# Patient Record
Sex: Male | Born: 1960 | Race: White | Hispanic: No | Marital: Married | Smoking: Former smoker
Health system: Southern US, Community
[De-identification: ages and names within clinical notes are randomized; demographics above are authoritative.]

## PROBLEM LIST (undated history)

## (undated) DIAGNOSIS — K449 Diaphragmatic hernia without obstruction or gangrene: Secondary | ICD-10-CM

## (undated) DIAGNOSIS — K219 Gastro-esophageal reflux disease without esophagitis: Secondary | ICD-10-CM

## (undated) DIAGNOSIS — E7800 Pure hypercholesterolemia, unspecified: Secondary | ICD-10-CM

## (undated) HISTORY — PX: ANTERIOR CRUCIATE LIGAMENT REPAIR: SHX115

---

## 2005-04-12 ENCOUNTER — Encounter: Admission: RE | Admit: 2005-04-12 | Discharge: 2005-04-12 | Payer: Self-pay | Admitting: Family Medicine

## 2005-04-12 IMAGING — RF DG UGI W/ HIGH DENSITY W/KUB
17 of 24 series · 17 of 24 positions shown · non-contrast
Comparison: none

CLINICAL DATA: Gastroesophageal reflux.  
 UPPER G.I. SERIES WITH HIGH DENSITY BARIUM AND KUB:
 KUB shows no evidence of obstruction, abnormal calcification or mass.  The bones appear normal.  With the aid of fluoroscopic visualization there was seen a somewhat prominent cricopharyngeus muscle with NAINA?NAINA diverticulum which measures less than 1 cm in maximum dimension.  The patient easily swallowed a 3 x 13 mm barium tablet. 
 There is noted a small  hiatal hernia with moderate gastroesophageal reflux.  The stomach appears normal in size and contour and shows no ulcer or mass.  Duodenal bulb and C-loop are normal.

[Series 1: run · 1 of 1 slices shown (1 of 16)]
[im 1/1]
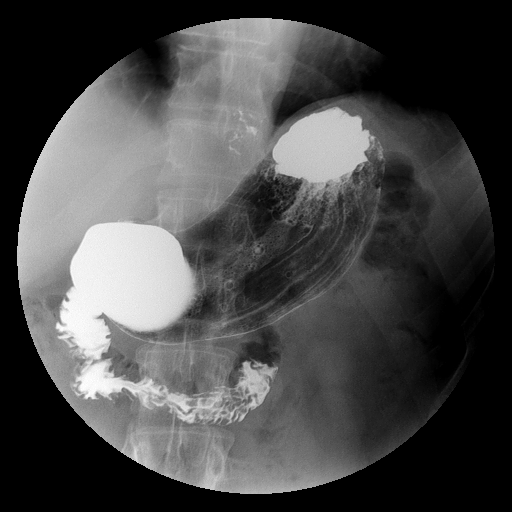

[Series 3: run · 1 of 1 slices shown (2 of 16)]
[im 1/1]
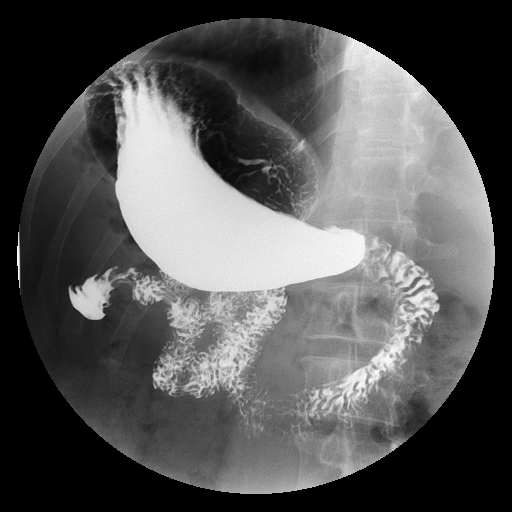

[Series 4: run · 1 of 1 slices shown (3 of 16)]
[im 1/1]
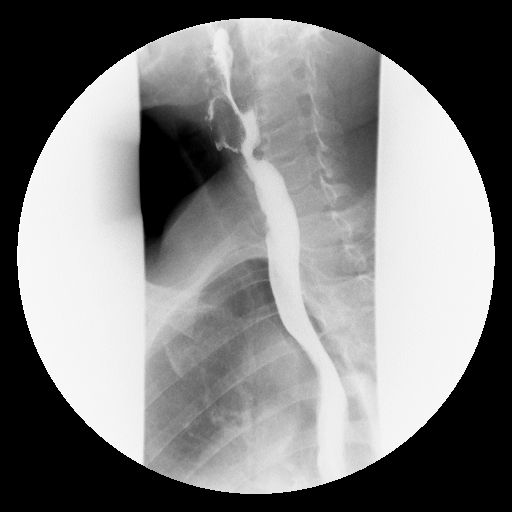

[Series 5: run · 1 of 1 slices shown (4 of 16)]
[im 1/1]
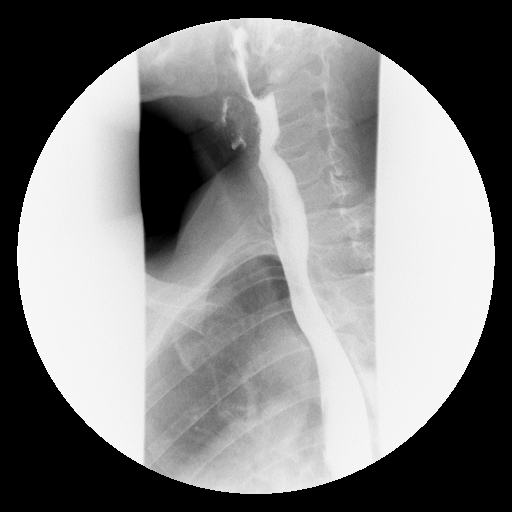

[Series 7: run · 1 of 1 slices shown (5 of 16)]
[im 1/1]
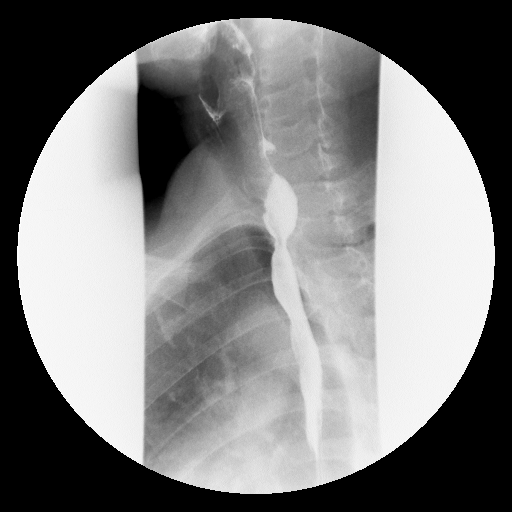

[Series 8: run · 1 of 1 slices shown (6 of 16)]
[im 1/1]
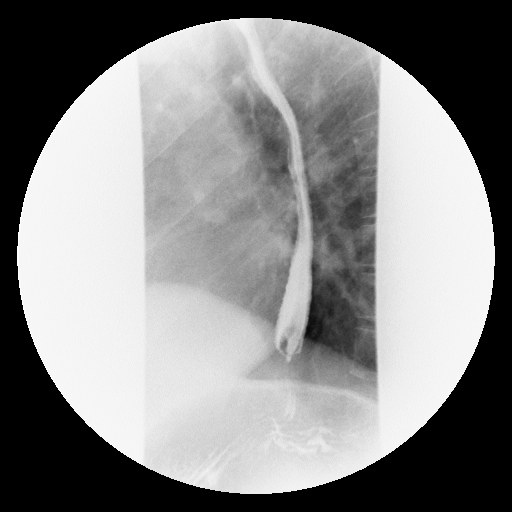

[Series 10: run · 1 of 1 slices shown (7 of 16)]
[im 1/1]
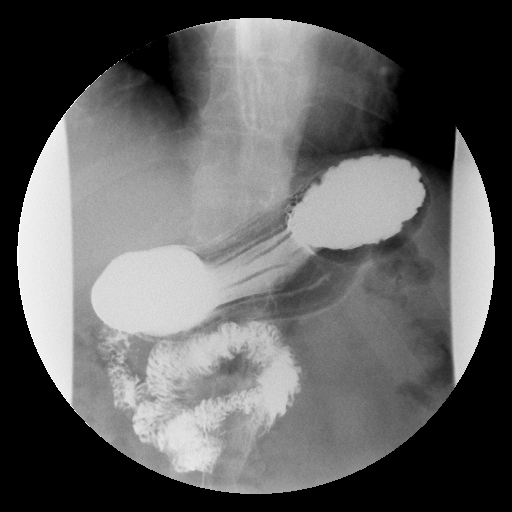

[Series 11: run · 1 of 1 slices shown (8 of 16)]
[im 1/1]
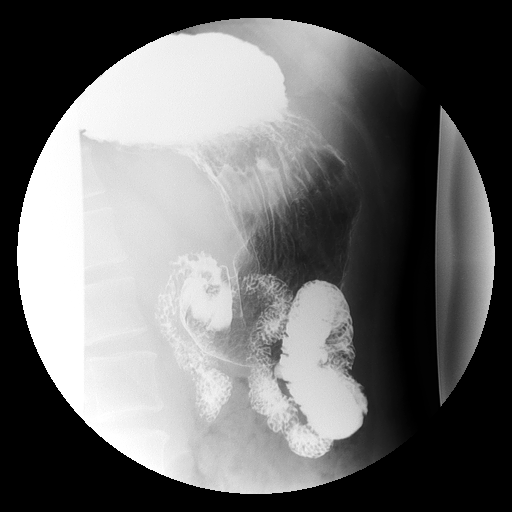

[Series 13: run · 1 of 1 slices shown (9 of 16)]
[im 1/1]
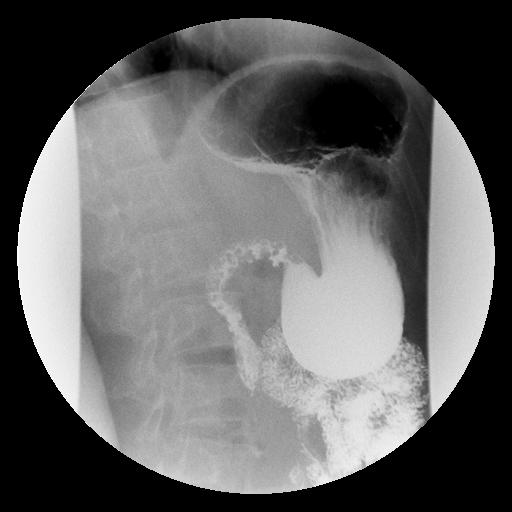

[Series 14: run · 1 of 1 slices shown (10 of 16)]
[im 1/1]
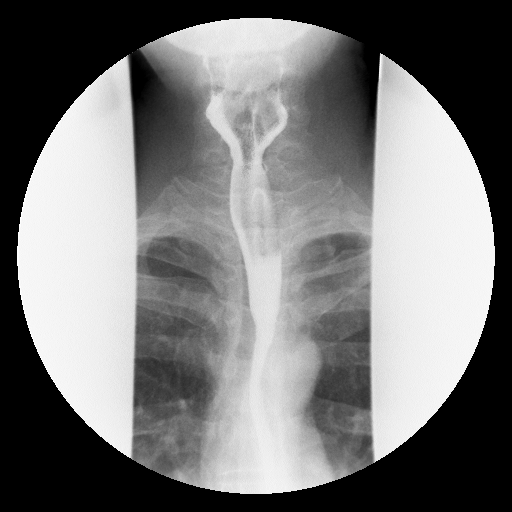

[Series 15: run · 1 of 1 slices shown (11 of 16)]
[im 1/1]
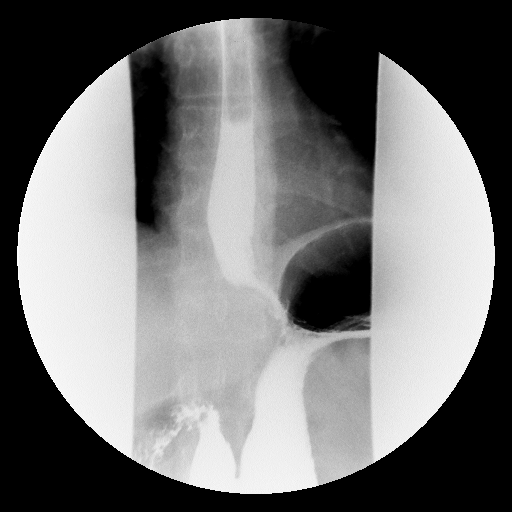

[Series 17: run · 1 of 1 slices shown (12 of 16)]
[im 1/1]
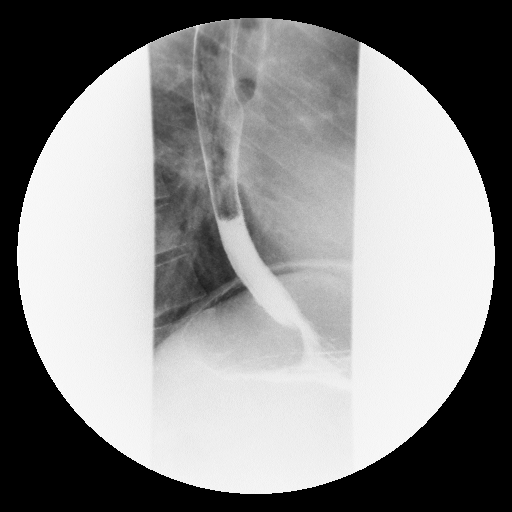

[Series 18: run · 1 of 1 slices shown (13 of 16)]
[im 1/1]
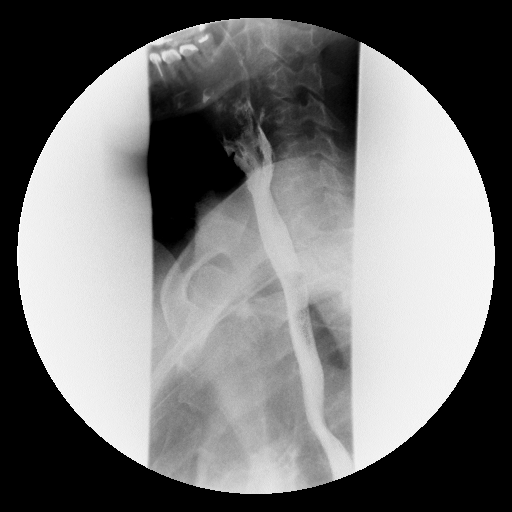

[Series 20: run · 1 of 1 slices shown (14 of 16)]
[im 1/1]
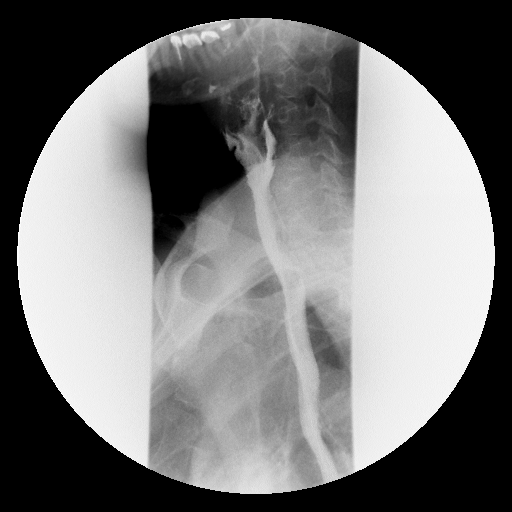

[Series 21: run · 1 of 1 slices shown (15 of 16)]
[im 1/1]
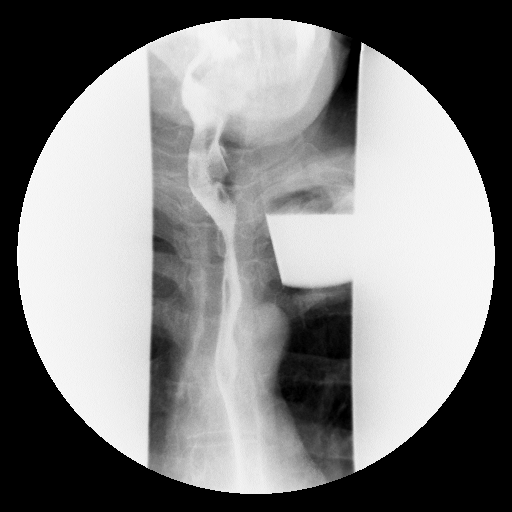

[Series 22: run · 1 of 1 slices shown (16 of 16)]
[im 1/1]
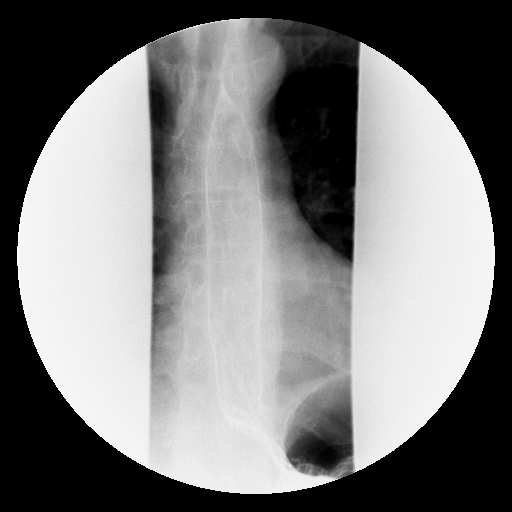

[Series 1002: view not recorded · 0.20mm/px · 1 of 1 slices shown]
[im 1/1]
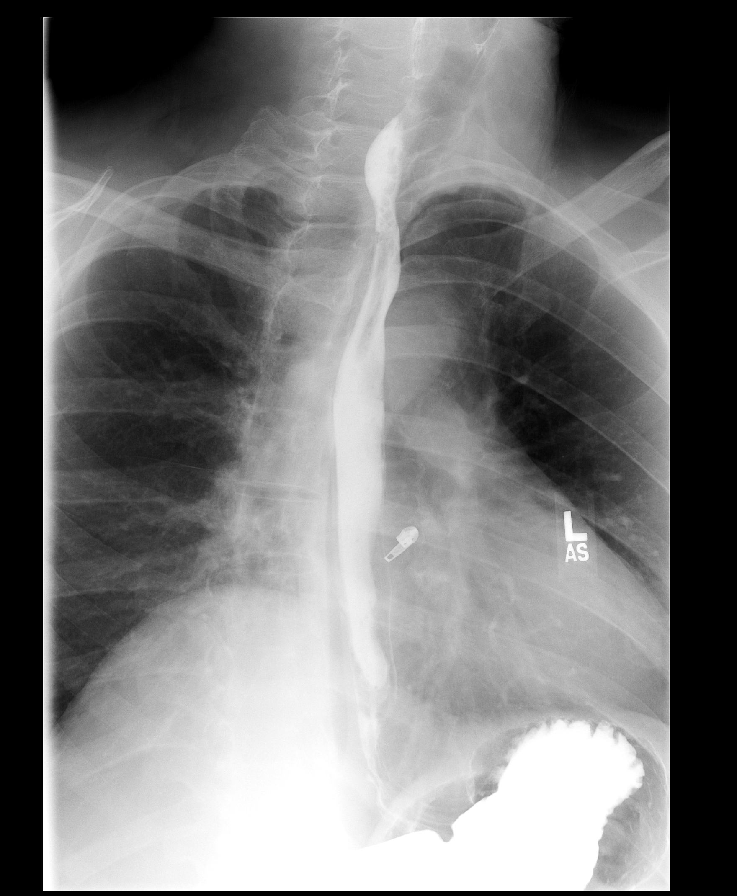

[17 of 24 positions shown; findings below may reference images not displayed]

IMPRESSION: 1.  Hiatal hernia with moderate reflux but no obstruction. .
 2.  Prominent cricopharyngeus muscle.  NAINA?NAINA diverticulum.

## 2010-09-25 ENCOUNTER — Encounter: Payer: Self-pay | Admitting: Family Medicine

## 2010-10-25 ENCOUNTER — Emergency Department (INDEPENDENT_AMBULATORY_CARE_PROVIDER_SITE_OTHER): Payer: BC Managed Care – PPO

## 2010-10-25 ENCOUNTER — Emergency Department (HOSPITAL_BASED_OUTPATIENT_CLINIC_OR_DEPARTMENT_OTHER)
Admission: EM | Admit: 2010-10-25 | Discharge: 2010-10-25 | Disposition: A | Discharge status: Home / Self Care | Payer: BC Managed Care – PPO | Attending: Emergency Medicine | Admitting: Emergency Medicine

## 2010-10-25 DIAGNOSIS — R0602 Shortness of breath: Secondary | ICD-10-CM

## 2010-10-25 DIAGNOSIS — F17200 Nicotine dependence, unspecified, uncomplicated: Secondary | ICD-10-CM | POA: Insufficient documentation

## 2010-10-25 DIAGNOSIS — J4489 Other specified chronic obstructive pulmonary disease: Secondary | ICD-10-CM | POA: Insufficient documentation

## 2010-10-25 DIAGNOSIS — J449 Chronic obstructive pulmonary disease, unspecified: Secondary | ICD-10-CM | POA: Insufficient documentation

## 2010-10-25 DIAGNOSIS — R0609 Other forms of dyspnea: Secondary | ICD-10-CM | POA: Insufficient documentation

## 2010-10-25 DIAGNOSIS — R0989 Other specified symptoms and signs involving the circulatory and respiratory systems: Secondary | ICD-10-CM | POA: Insufficient documentation

## 2010-10-25 LAB — DIFFERENTIAL
Basophils Relative: 1 % (ref 0–1)
Eosinophils Absolute: 0.1 10*3/uL (ref 0.0–0.7)
Eosinophils Relative: 2 % (ref 0–5)
Lymphs Abs: 2.7 10*3/uL (ref 0.7–4.0)
Neutrophils Relative %: 59 % (ref 43–77)

## 2010-10-25 LAB — BASIC METABOLIC PANEL
CO2: 23 mEq/L (ref 19–32)
Calcium: 10.4 mg/dL (ref 8.4–10.5)
Creatinine, Ser: 1.2 mg/dL (ref 0.4–1.5)
GFR calc non Af Amer: >60 mL/min (ref >60)
Glucose, Bld: 124 mg/dL — ABNORMAL HIGH (ref 70–99)
Potassium: 3.9 mEq/L (ref 3.5–5.1)

## 2010-10-25 LAB — CBC
HCT: 46 % (ref 39.0–52.0)
Hemoglobin: 16.1 g/dL (ref 13.0–17.0)
Platelets: 276 10*3/uL (ref 150–400)
RBC: 5.65 MIL/uL (ref 4.22–5.81)
WBC: 8.6 10*3/uL (ref 4.0–10.5)

## 2010-10-25 MED ORDER — IOHEXOL 350 MG/ML SOLN
80.0000 mL | Freq: Once | INTRAVENOUS | Status: AC | PRN
  Administered 2010-10-25: 80 mL via INTRAVENOUS

## 2010-11-18 ENCOUNTER — Inpatient Hospital Stay (HOSPITAL_BASED_OUTPATIENT_CLINIC_OR_DEPARTMENT_OTHER)
Admission: RE | Admit: 2010-11-18 | Discharge: 2010-11-18 | Disposition: A | Discharge status: Home / Self Care | Payer: BC Managed Care – PPO | Source: Ambulatory Visit | Attending: Interventional Cardiology | Admitting: Interventional Cardiology

## 2010-11-18 DIAGNOSIS — R55 Syncope and collapse: Secondary | ICD-10-CM | POA: Insufficient documentation

## 2010-11-18 DIAGNOSIS — R0602 Shortness of breath: Secondary | ICD-10-CM | POA: Insufficient documentation

## 2010-12-07 NOTE — Procedures (Signed)
NAME:  Isaac Casey, Isaac Casey NO.:  [id number]  MEDICAL RECORD NO.:  [id number]           PATIENT TYPE:  LOCATION:                                 FACILITY:  PHYSICIAN:  Corky Crafts, MDDATE OF BIRTH:  [birth year]  DATE OF PROCEDURE:  11/18/2010 DATE OF DISCHARGE:                           CARDIAC CATHETERIZATION   REFERRING PHYSICIAN:  Dr. Ginnie Smart, Alita Chyle.  PROCEDURE PERFORMED:  Left heart catheterization, left ventriculogram, coronary angiogram, abdominal aortogram.  OPERATOR:  Corky Crafts, MD  INDICATION:  Shortness of breath and syncope.  PROCEDURAL NARRATIVE:  The risks and benefits of cardiac catheterization were explained to the patient.  Informed consent was obtained.  He was brought to the cath lab.  He was prepped and draped in the usual sterile fashion.  His right groin was infiltrated with 1% lidocaine.  A 4-French sheath was placed into the right common femoral artery using modified Seldinger technique.  Left coronary artery angiography was performed using a JL-4.0 Pigtail catheter.  The catheter was advanced to about the vessel ostium under fluoroscopic guidance.  Digital angiography was performed in multiple projections and injection of contrast.  The right coronary artery angiography was then performed using a 3-D RC catheter in a similar fashion.  Pigtail catheter was advanced to the ascending aorta and across the aortic valve under fluoroscopic guidance.  Power injection of contrast performed in the RAO projection to image the left ventricle.  The catheter was pulled back under continuous hemodynamic monitoring.  The catheter was withdrawn to the abdominal aorta, and a power injection of contrast was performed in the AP projection.  The sheath was removed and manual compression was used for hemostasis.  FINDINGS: 1. The left main is short vessel and appears widely patent. 2. Left circumflex is a large vessel that appears  angiographically     normal. 3. The OM-1 is a large vessel that appears widely patent.  The     remainder of the circumflex is small vessel and widely patent. 4. Left anterior descending is a large vessel that wraps around the     apex.  There are three small diagonals all of which are widely     patent.  The LAD system appears angiographically normal. 5. The right coronary artery is large dominant vessel.  The     posterolateral artery and PDA are medium-sized vessels and patent.     The right coronary system appears angiographically normal. 6. Left ventriculogram shows mildly decreased LV function, the     estimated ejection fraction is 45%.  There is no significant mitral     regurgitation.  HEMODYNAMICS: 1. Left ventricular pressure 95/80 with an LVEDP of 10 mmHg.  Aortic     pressure 105/71 with a mean aortic pressure of 87 mmHg. 2. Abdominal aortogram.  No abdominal aortic aneurysm.  The left     kidney appears that dual arterial supply.  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  Both sets of the vessels appear     widely patent.  The SMA appears patent.  IMPRESSION: 1.  No significant coronary artery disease. 2. Mildly decreased LV function with an estimated ejection fraction of     45%. 3. Normal hemodynamics. 4. No abdominal aortic aneurysm or renal artery stenosis.  RECOMMENDATIONS:  Continue medical therapy and risk factor modification including smoking cessation.     Corky Crafts, MD     JSV/MEDQ  D:  11/18/2010  T:  11/19/2010  Job:  [id number]  Electronically Signed by Lance Muss MD on 12/07/2010 09:17:35 AM

## 2012-01-04 ENCOUNTER — Other Ambulatory Visit: Payer: Self-pay | Admitting: Diagnostic Neuroimaging

## 2012-01-04 DIAGNOSIS — R55 Syncope and collapse: Secondary | ICD-10-CM

## 2012-01-24 ENCOUNTER — Other Ambulatory Visit: Payer: BC Managed Care – PPO

## 2014-10-28 ENCOUNTER — Emergency Department (HOSPITAL_BASED_OUTPATIENT_CLINIC_OR_DEPARTMENT_OTHER)
Admission: EM | Admit: 2014-10-28 | Discharge: 2014-10-28 | Disposition: A | Discharge status: Home / Self Care | Payer: BLUE CROSS/BLUE SHIELD | Attending: Emergency Medicine | Admitting: Emergency Medicine

## 2014-10-28 ENCOUNTER — Emergency Department (HOSPITAL_BASED_OUTPATIENT_CLINIC_OR_DEPARTMENT_OTHER): Payer: BLUE CROSS/BLUE SHIELD

## 2014-10-28 ENCOUNTER — Encounter (HOSPITAL_BASED_OUTPATIENT_CLINIC_OR_DEPARTMENT_OTHER): Payer: Self-pay

## 2014-10-28 DIAGNOSIS — E780 Pure hypercholesterolemia: Secondary | ICD-10-CM | POA: Diagnosis not present

## 2014-10-28 DIAGNOSIS — Y9389 Activity, other specified: Secondary | ICD-10-CM | POA: Insufficient documentation

## 2014-10-28 DIAGNOSIS — Z7982 Long term (current) use of aspirin: Secondary | ICD-10-CM | POA: Diagnosis not present

## 2014-10-28 DIAGNOSIS — X58XXXA Exposure to other specified factors, initial encounter: Secondary | ICD-10-CM | POA: Diagnosis not present

## 2014-10-28 DIAGNOSIS — S29011A Strain of muscle and tendon of front wall of thorax, initial encounter: Secondary | ICD-10-CM | POA: Diagnosis not present

## 2014-10-28 DIAGNOSIS — Y9289 Other specified places as the place of occurrence of the external cause: Secondary | ICD-10-CM | POA: Diagnosis not present

## 2014-10-28 DIAGNOSIS — Z8719 Personal history of other diseases of the digestive system: Secondary | ICD-10-CM | POA: Diagnosis not present

## 2014-10-28 DIAGNOSIS — Y998 Other external cause status: Secondary | ICD-10-CM | POA: Diagnosis not present

## 2014-10-28 DIAGNOSIS — R0789 Other chest pain: Secondary | ICD-10-CM

## 2014-10-28 DIAGNOSIS — Z720 Tobacco use: Secondary | ICD-10-CM | POA: Diagnosis not present

## 2014-10-28 DIAGNOSIS — S299XXA Unspecified injury of thorax, initial encounter: Secondary | ICD-10-CM | POA: Diagnosis present

## 2014-10-28 HISTORY — DX: Pure hypercholesterolemia, unspecified: E78.00

## 2014-10-28 HISTORY — DX: Gastro-esophageal reflux disease without esophagitis: K21.9

## 2014-10-28 HISTORY — DX: Diaphragmatic hernia without obstruction or gangrene: K44.9

## 2014-10-28 MED ORDER — IBUPROFEN 800 MG PO TABS: 800.0000 mg | ORAL_TABLET | Freq: Three times a day (TID) | ORAL | Status: AC

## 2014-10-28 MED ORDER — METHOCARBAMOL 500 MG PO TABS: 500.0000 mg | ORAL_TABLET | Freq: Two times a day (BID) | ORAL | Status: DC

## 2014-10-28 MED ORDER — METHOCARBAMOL 500 MG PO TABS: 500.0000 mg | ORAL_TABLET | Freq: Once | ORAL | Status: DC

## 2014-10-28 NOTE — ED Provider Notes (Signed)
CSN: [id number]     Arrival date & time 10/28/14  1842 History   First MD Initiated Contact with Patient 10/28/14 2033     Chief Complaint  Patient presents with  . Mass     (Consider location/radiation/quality/duration/timing/severity/associated sxs/prior Treatment) The history is provided by the patient and medical records. No language interpreter was used.     Isaac AgeeWilliam G Casey is a 54 y.o. male  with a hx of GERD, hiatal hernia, high cholesterol presents to the Emergency Department complaining of gradual, persistent, progressively worsening left sided chest pain onset yesterday evening after bailing water out of his boat.  Pt reports he was using his left a bucket to empty the boat of retained rainwater and was "throwing" it over his left shoulder.  Pt reports he had left rib pain worse with movement and deep inspiration.  He reports no treatments PTA. Pt denies chest pain with exertion, SOB with exertion, diaphoresis, nausea, weakness, syncope/near syncope.  Pt denies personal or family Hx of cardiac disease.     Past Medical History  Diagnosis Date  . GERD (gastroesophageal reflux disease)   . Hiatal hernia   . High cholesterol    Past Surgical History  Procedure Laterality Date  . Anterior cruciate ligament repair     No family history on file. History  Substance Use Topics  . Smoking status: Current Some Day Smoker  . Smokeless tobacco: Not on file  . Alcohol Use: No    Review of Systems  Constitutional: Negative for fever, diaphoresis, appetite change, fatigue and unexpected weight change.  HENT: Negative for mouth sores.   Eyes: Negative for visual disturbance.  Respiratory: Negative for cough, chest tightness, shortness of breath and wheezing.   Cardiovascular: Positive for chest pain (chest wall pain).  Gastrointestinal: Negative for nausea, vomiting, abdominal pain, diarrhea and constipation.  Endocrine: Negative for polydipsia, polyphagia and polyuria.   Genitourinary: Negative for dysuria, urgency, frequency and hematuria.  Musculoskeletal: Negative for back pain and neck stiffness.  Skin: Negative for rash.  Allergic/Immunologic: Negative for immunocompromised state.  Neurological: Negative for syncope, light-headedness and headaches.  Hematological: Does not bruise/bleed easily.  Psychiatric/Behavioral: Negative for sleep disturbance. The patient is not nervous/anxious.       Allergies  Wellbutrin  Home Medications   Prior to Admission medications   Medication Sig Start Date End Date Taking? Authorizing Provider  aspirin EC 81 MG tablet Take 81 mg by mouth daily.   Yes Historical Provider, MD  Cholecalciferol (VITAMIN D PO) Take by mouth.   Yes Historical Provider, MD  omega-3 acid ethyl esters (LOVAZA) 1 G capsule Take by mouth 2 (two) times daily.   Yes Historical Provider, MD  SIMVASTATIN PO Take by mouth.   Yes Historical Provider, MD  ibuprofen (ADVIL,MOTRIN) 800 MG tablet Take 1 tablet (800 mg total) by mouth 3 (three) times daily. 10/28/14   Tonee Silverstein, PA-C  methocarbamol (ROBAXIN) 500 MG tablet Take 1 tablet (500 mg total) by mouth 2 (two) times daily. 10/28/14   Jewell Haught, PA-C   BP 129/83 mmHg  Pulse 95  Temp(Src) 98.1 F (36.7 C) (Oral)  Resp 16  Ht 6' (1.829 m)  Wt 185 lb (83.915 kg)  BMI 25.08 kg/m2  SpO2 100% Physical Exam  Constitutional: He appears well-developed and well-nourished. No distress.  Awake, alert, nontoxic appearance  HENT:  Head: Normocephalic and atraumatic.  Mouth/Throat: Oropharynx is clear and moist. No oropharyngeal exudate.  Eyes: Conjunctivae are normal. No scleral icterus.  Neck: Normal range of motion. Neck supple.  Cardiovascular: Normal rate, regular rhythm, normal heart sounds and intact distal pulses.   No murmur heard. Pulmonary/Chest: Effort normal and breath sounds normal. No respiratory distress. He has no wheezes. He exhibits tenderness.  Equal chest  expansion Clear and equal breath sounds Tenderness to palpation along the intercostal muscle between the ninth and 10th ribs No deformity, flail segment, crepitus or ecchymosis  Abdominal: Soft. Bowel sounds are normal. He exhibits no mass. There is no tenderness. There is no rebound and no guarding.  Musculoskeletal: Normal range of motion. He exhibits no edema.  Neurological: He is alert.  Speech is clear and goal oriented Moves extremities without ataxia  Skin: Skin is warm and dry. He is not diaphoretic.  Psychiatric: He has a normal mood and affect.  Nursing note and vitals reviewed.   ED Course  Procedures (including critical care time) Labs Review Labs Reviewed - No data to display  Imaging Review Dg Ribs Unilateral W/chest Left  10/28/2014   CLINICAL DATA:  Lump on the left anterior lower rib cage last night. Painful when he takes a deep breath. Lump is localized by a marker.  EXAM: LEFT RIBS AND CHEST - 3+ VIEW  COMPARISON:  10/25/2010  FINDINGS: Normal heart size and pulmonary vascularity. No focal airspace disease or consolidation in the lungs. No blunting of costophrenic angles. No pneumothorax. Mediastinal contours appear intact.  No fracture or other bone lesions are seen involving the ribs. No rib deformity or radiopaque abnormality is demonstrated corresponding to the location of the palpable lesion.  IMPRESSION: Negative. No focal abnormalities identified that would correspond to the palpable lesion.   Electronically Signed   By: Burman Nieves M.D.   On: 10/28/2014 19:31     EKG Interpretation   Date/Time:  Wednesday October 28 2014 21:21:54 EST Ventricular Rate:  64 PR Interval:  146 QRS Duration: 84 QT Interval:  392 QTC Calculation: 404 R Axis:   4 Text Interpretation:  Normal sinus rhythm Normal ECG No significant change  was found Confirmed by Manus Gunning  MD, STEPHEN [phone]) on 10/28/2014 9:22:23  PM      MDM   Final diagnoses:  Chest wall pain   Intercostal muscle strain, initial encounter   Isaac Casey presents with left rib pain consistent with chest wall pain and muscle strain.  CXR without evidence of PNA or pneumothorax.  ECG nonischemic and pts pain is not consistent with ACS.  Pt given muscle relaxer and antiinflammatory.  Pt is consistently reproducible with movement and palpation of the chest.    I have personally reviewed patient's vitals, nursing note and any pertinent labs or imaging.  I performed an undressed physical exam.    It has been determined that no acute conditions requiring further emergency intervention are present at this time. The patient/guardian have been advised of the diagnosis and plan. I reviewed all labs and imaging including any potential incidental findings. We have discussed signs and symptoms that warrant return to the ED and they are listed in the discharge instructions.    Vital signs are stable at discharge.   BP 129/83 mmHg  Pulse 95  Temp(Src) 98.1 F (36.7 C) (Oral)  Resp 16  Ht 6' (1.829 m)  Wt 185 lb (83.915 kg)  BMI 25.08 kg/m2  SpO2 100%         Dierdre Forth, PA-C 10/28/14 2239  [facility], PA-C 10/28/14 2240  Glynn Octave, MD 10/29/14 1914

## 2014-10-28 NOTE — ED Notes (Signed)
C/o noticed "lump" to left rib area-soreness with movement and deep breath

## 2014-10-28 NOTE — Discharge Instructions (Signed)
1. Medications: robaxin, ibuprofen, usual home medications 2. Treatment: rest, drink plenty of fluids,  3. Follow Up: Please followup with your primary doctor in 2-3 days for discussion of your diagnoses and further evaluation after today's visit; if you do not have a primary care doctor use the resource guide provided to find one; Please return to the ER for worsening pain, shortness of breath or other concerning symptoms   Chest Wall Pain Chest wall pain is pain in or around the bones and muscles of your chest. It may take up to 6 weeks to get better. It may take longer if you must stay physically active in your work and activities.  CAUSES  Chest wall pain may happen on its own. However, it may be caused by:  A viral illness like the flu.  Injury.  Coughing.  Exercise.  Arthritis.  Fibromyalgia.  Shingles. HOME CARE INSTRUCTIONS   Avoid overtiring physical activity. Try not to strain or perform activities that cause pain. This includes any activities using your chest or your abdominal and side muscles, especially if heavy weights are used.  Put ice on the sore area.  Put ice in a plastic bag.  Place a towel between your skin and the bag.  Leave the ice on for 15-20 minutes per hour while awake for the first 2 days.  Only take over-the-counter or prescription medicines for pain, discomfort, or fever as directed by your caregiver. SEEK IMMEDIATE MEDICAL CARE IF:   Your pain increases, or you are very uncomfortable.  You have a fever.  Your chest pain becomes worse.  You have new, unexplained symptoms.  You have nausea or vomiting.  You feel sweaty or lightheaded.  You have a cough with phlegm (sputum), or you cough up blood. MAKE SURE YOU:   Understand these instructions.  Will watch your condition.  Will get help right away if you are not doing well or get worse. Document Released: 08/21/2005 Document Revised: 11/13/2011 Document Reviewed:  04/17/2011 [facility] Patient Information 2015 [place], [place]. This information is not intended to replace advice given to you by your health care provider. Make sure you discuss any questions you have with your health care provider.    Emergency Department Resource Guide 1) Find a Doctor and Pay Out of Pocket Although you won't have to find out who is covered by your insurance plan, it is a good idea to ask around and get recommendations. You will then need to call the office and see if the doctor you have chosen will accept you as a new patient and what types of options they offer for patients who are self-pay. Some doctors offer discounts or will set up payment plans for their patients who do not have insurance, but you will need to ask so you aren't surprised when you get to your appointment.  2) Contact Your Local Health Department Not all health departments have doctors that can see patients for sick visits, but many do, so it is worth a call to see if yours does. If you don't know where your local health department is, you can check in your phone book. The CDC also has a tool to help you locate your state's health department, and many state websites also have listings of all of their local health departments.  3) Find a Walk-in Clinic If your illness is not likely to be very severe or complicated, you may want to try a walk in clinic. These are popping up all over the  country in pharmacies, drugstores, and shopping centers. They're usually staffed by nurse practitioners or physician assistants that have been trained to treat common illnesses and complaints. They're usually fairly quick and inexpensive. However, if you have serious medical issues or chronic medical problems, these are probably not your best option.  No Primary Care Doctor: - Call Health Connect at  [phone] - they can help you locate a primary care doctor that  accepts your insurance, provides certain services, etc. - Physician  Referral Service- [phone]  Chronic Pain Problems: Organization         Address  Phone   Notes  [facility]  [phone] Patients need to be referred by their primary care doctor.   Medication Assistance: Organization         Address  Phone   Notes  [facility] Medication [facility] [address] [place] [place], [place] [address] [phone] --Must be a resident of [facility] -- Must have NO insurance coverage whatsoever (no Medicaid/ Medicare, etc.) -- The pt. MUST have a primary care doctor that directs their care regularly and follows them in the community   [facility]  [phone]   [employer]  [phone]    Agencies that provide inexpensive medical care: Organization         Address  Phone   Notes  [facility]  [phone]   [facility]    [phone]   [facility] [address] [place], [place] [address] [phone]   [facility] [address] [place], [place] [phone]   Planned Parenthood    [phone]   [facility]    [phone]   [facility] and [facility]  [address], [place] Phone:  [phone], Fax:  [phone] Hours of Operation:  9 am - 6 pm, M-F.  Also accepts Medicaid/Medicare and self-pay.  [facility]  [address], [place] Phone: [phone], Fax: [phone]. Hours of Operation:  8:30 am - 5:30 pm, M-F.  Also accepts Medicaid and self-pay.  [facility] [place] [address], [place] Phone: [phone]   [facility] [address] Natasha Bence [place], [place] [phone], Ext. [phone] Mondays & Thursdays: 7-9 AM.  First 15 patients are seen on a first come, first serve basis.    Medicaid-accepting [facility] Providers:  Organization         Address  Phone   Notes  [facility] [address] [place] [address], [place] [phone] Also accepts self-pay patients.  [facility] [address] Laurell Josephs [place], [place]  [phone]   [facility] [address], [place] [phone]   [facility] [address] [place] [phone]   Renaye Rakers [address], [place]   [phone] Only accepts [place] Access [place] patients after they have their name applied to their card.   Self-Pay (no insurance) in [facility]:  Organization         Address  Phone   Notes  Sickle Cell Patients, [facility] [address] [employer], [place] [phone]   [facility] [address] [place], [place] [phone]   [facility]   [address], [place] [phone]   [facility]/Dr. Osei-Bonsu  [address] [place] [address], [place] or [address], [place] [phone] Phone number for both [place] and [place] locations is the same.  [facility]gent [facility] and [facility] [address], [place] [phone]   [facility] [address], [place] or [address] [phone]   [facility] [address] [place] [phone], phone; [phone], fax Sees patients 1st and 3rd Saturday of every month.  Must not qualify for public or private insurance (i.e. Medicaid, Medicare, [place] Health Choice, Veterans' Benefits)  Household income should be no more than 200% of the poverty level The clinic cannot treat you if you are pregnant or think you are pregnant  Sexually transmitted diseases are not treated at the clinic.    Dental Care: Organization         Address  Phone  Notes  [facility] Department of [facility] [address] [place], [place] [phone] Accepts children up to age 51 who are enrolled  in [place] or [place] Health Choice; pregnant women with a Medicaid card; and children who have applied for Medicaid or [place] Health Choice, but were declined, whose parents can pay a reduced fee at time of service.  [facility] Department of [facility]  [address], [place] [phone] Accepts children up to age 12 who are enrolled in [place] or [place] Health Choice; pregnant women with a Medicaid card; and children who have applied for Medicaid or [place] Health Choice, but were declined, whose parents can pay a reduced fee at time of service.  [facility]  [address] [place], [place] [phone] Patients are seen by appointment only. Walk-ins are not accepted. [facility] will see patients 24 years of age and older. Monday - Tuesday (8am-5pm) Most Wednesdays (8:30-5pm) $30 per visit, cash only  [facility] Adult Dental Access PRO[facility]  [address], [facility] [phone] Patients are seen by appointment only. Walk-ins are not accepted. [facility] will see patients 82 years of age and older. One Wednesday Evening (Monthly: Volunteer Based).  $30 per visit, cash only  [employer]  [phone] for adults; Children under age 48, call Graduate Pediatric Dentistry at [phone]. Children aged 33-14, please call [phone] to request a pediatric application.  Dental services are provided in all areas of dental care including fillings, crowns and bridges, complete and partial dentures, implants, gum treatment, root canals, and extractions. Preventive care is also provided. Treatment is provided to both adults and children. Patients are selected via a lottery and there is often a waiting list.   [facility] [address], [place]  [phone] [email or web address]   [facility] [address] [place], [place] [phone], Ext. [phone] Second and Fourth Thursday of each month, opens at  6:30 AM; Clinic ends at 9 AM.  Patients are seen on a first-come first-served basis, and a limited number are seen during each clinic.   [facility]  [address] Ether Griffins [place], [place] [phone]   Eligibility Requirements You must have lived in [place], [place], or [place] counties for at least the last three months.   You cannot be eligible for state or federal sponsored [place], including [employer], [place], or [employer].   You  generally cannot be eligible for healthcare insurance through your employer.    How to apply: Eligibility screenings are held every Tuesday and Wednesday afternoon from 1:00 pm until 4:00 pm. You do not need an appointment for the interview!  [facility] [address] [place], [place] [phone]   [facility] Health Department  [phone]   [facility] Health Department  [phone]   [facility] Health Department  [phone]    Behavioral Health Resources in the Community: Intensive Outpatient Programs Organization         Address  Phone  Notes  [facility] [address], [place], [place] [phone]   [facility] Out[facility] [address], [place], [place] [phone]   [facility] [address], [place], [place]  [phone]   [facility] Mental Health [address]  [place], [place] [phone] or [phone]   Substance Abuse Resources Organization         Address  Phone  Notes  [facility] Drug Services  [phone]   [facility]  [phone]   [facility]  [phone]   Floydene Flock  [phone]   Residential & Outpatient Substance Abuse Program  [phone]   Psychological Services Organization         Address  Phone  Notes  [facility]  [phone]   [facility]  [phone]   [facility] [address], [place]  [phone] or [phone]    Mobile Crisis Teams Organization         Address  Phone  Notes  [facility], Mobile Crisis Care Unit  [phone]   [facility]  [address] [place], [place] [phone]   Doristine Locks [address] [place] [phone]    Self-Help/Support Groups Organization         Address  Phone             Notes  [facility] [place] - variety of support groups  [phone]- I7437963 Call for more information  Narcotics Anonymous (NA), [facility] [address], [employer] [place]  2 meetings at this location   Statistician         Address  Phone  Notes  [facility] [address],    [place]  [phone]   [facility]  [address], [place] [address], [place], [place] [phone]   [facility] [address] [place], [place] [phone] Admissions: 8am-3pm M-F  [facility] [address],    [place], [place] [phone]   [facility] [address] [place], [place], [place] [phone]   [facility] [address]  [place], [place] [phone]   [facility] - Intensive Outpatient [address] Laurell Josephs [address], [place], [place] [phone]   [facility] ([facility] [address] [place]  [place], [place] [phone] or [phone]   [facility] ([facility]) [address] [place], [place] [phone] Accepts Medicaid  [facility] [address],  [place] [phone] Substance Abuse/Addiction Treatment   [facility] Organization         Address  Phone  Notes  [facility]  [phone]   Angie Fava, PhD [address] Ervin Knack [place], [place]   [phone] or [phone]   [facility]   [address]  [address] [place], [place] [phone]) [phone]   [facility]  [address], [place], [place] [phone]) [phone] Insurance/Medicaid/sponsorship through [facility]  [facility] [address]                                    [place], [place] [phone]) [phone] Therapy/tele-psych/case  [facility] [address]   [place], [place] [phone](336) [phone]    Dr. Lolly MustacheArfeen  [phone](336) [phone]   [facility] [place]  [employer] [facility] 1) [address], [place] 2) [address], [place] 3)  [address], [place] [phone]) [phone](336) [phone]  [phone]) [phone]   [facility] Child Abuse Hotline [phone](336) [phone] or [phone](336) [phone] (After Hours)

## 2016-07-07 ENCOUNTER — Other Ambulatory Visit: Payer: Self-pay | Admitting: Family Medicine

## 2016-07-07 ENCOUNTER — Ambulatory Visit
Admission: RE | Admit: 2016-07-07 | Discharge: 2016-07-07 | Disposition: A | Discharge status: Home / Self Care | Payer: BLUE CROSS/BLUE SHIELD | Source: Ambulatory Visit | Attending: Family Medicine | Admitting: Family Medicine

## 2016-07-07 DIAGNOSIS — R0602 Shortness of breath: Secondary | ICD-10-CM

## 2016-12-26 ENCOUNTER — Ambulatory Visit
Admission: RE | Admit: 2016-12-26 | Discharge: 2016-12-26 | Disposition: A | Discharge status: Home / Self Care | Payer: Managed Care, Other (non HMO) | Source: Ambulatory Visit | Attending: Family Medicine | Admitting: Family Medicine

## 2016-12-26 ENCOUNTER — Other Ambulatory Visit: Payer: Self-pay | Admitting: Family Medicine

## 2016-12-26 DIAGNOSIS — R079 Chest pain, unspecified: Secondary | ICD-10-CM

## 2017-01-25 ENCOUNTER — Other Ambulatory Visit: Payer: Self-pay | Admitting: Family Medicine

## 2017-01-25 DIAGNOSIS — R079 Chest pain, unspecified: Secondary | ICD-10-CM

## 2017-01-30 ENCOUNTER — Ambulatory Visit
Admission: RE | Admit: 2017-01-30 | Discharge: 2017-01-30 | Disposition: A | Discharge status: Home / Self Care | Payer: Managed Care, Other (non HMO) | Source: Ambulatory Visit | Attending: Family Medicine | Admitting: Family Medicine

## 2017-01-30 DIAGNOSIS — R079 Chest pain, unspecified: Secondary | ICD-10-CM

## 2018-06-07 ENCOUNTER — Other Ambulatory Visit: Payer: Self-pay | Admitting: Acute Care

## 2018-06-07 DIAGNOSIS — Z87891 Personal history of nicotine dependence: Secondary | ICD-10-CM

## 2018-06-07 DIAGNOSIS — Z122 Encounter for screening for malignant neoplasm of respiratory organs: Secondary | ICD-10-CM

## 2018-06-19 ENCOUNTER — Ambulatory Visit (INDEPENDENT_AMBULATORY_CARE_PROVIDER_SITE_OTHER)
Admission: RE | Admit: 2018-06-19 | Discharge: 2018-06-19 | Disposition: A | Discharge status: Home / Self Care | Payer: Managed Care, Other (non HMO) | Source: Ambulatory Visit | Attending: Acute Care | Admitting: Acute Care

## 2018-06-19 ENCOUNTER — Ambulatory Visit (INDEPENDENT_AMBULATORY_CARE_PROVIDER_SITE_OTHER): Payer: Managed Care, Other (non HMO) | Admitting: Acute Care

## 2018-06-19 ENCOUNTER — Encounter: Payer: Self-pay | Admitting: Acute Care

## 2018-06-19 DIAGNOSIS — Z87891 Personal history of nicotine dependence: Secondary | ICD-10-CM

## 2018-06-19 DIAGNOSIS — Z122 Encounter for screening for malignant neoplasm of respiratory organs: Secondary | ICD-10-CM

## 2018-06-19 NOTE — Progress Notes (Signed)
Shared Decision Making Visit Lung Cancer Screening Program [phone])   Eligibility:  Age 57 y.o.  Pack Years Smoking History Calculation 38 pack year smoking history (# packs/per year x # years smoked)  Recent History of coughing up blood  no  Unexplained weight loss? no ( >Than 15 pounds within the last 6 months )  Prior History Lung / other cancer no (Diagnosis within the last 5 years already requiring surveillance chest CT Scans).  Smoking Status Former Smoker  Former Smokers: Years since quit: 2 years  Quit Date: 03/2016  Visit Components:  Discussion included one or more decision making aids. yes  Discussion included risk/benefits of screening. yes  Discussion included potential follow up diagnostic testing for abnormal scans. yes  Discussion included meaning and risk of over diagnosis. yes  Discussion included meaning and risk of False Positives. yes  Discussion included meaning of total radiation exposure. yes  Counseling Included:  Importance of adherence to annual lung cancer LDCT screening. yes  Impact of comorbidities on ability to participate in the program. yes  Ability and willingness to under diagnostic treatment. yes  Smoking Cessation Counseling:  Current Smokers:   Discussed importance of smoking cessation. NA  Information about tobacco cessation classes and interventions provided to patient. yes  Patient provided with "ticket" for LDCT Scan. yes  Symptomatic Patient. no  Counseling   Diagnosis Code: Tobacco Use Z72.0  Asymptomatic Patient yes  Counseling (Intermediate counseling: > three minutes counseling) U0454  Former Smokers:   Discussed the importance of maintaining cigarette abstinence. yes  Diagnosis Code: Personal History of Nicotine Dependence. U98.119  Information about tobacco cessation classes and interventions provided to patient. Yes  Patient provided with "ticket" for LDCT Scan. yes  Written Order for Lung Cancer  Screening with LDCT placed in Epic. Yes (CT Chest Lung Cancer Screening Low Dose W/O CM) [id number] Z12.2-Screening of respiratory organs Z87.891-Personal history of nicotine dependence  I spent 25 minutes of face to face time with Isaac Casey discussing the risks and benefits of lung cancer screening. We viewed a power point together that explained in detail the above noted topics. We took the time to pause the power point at intervals to allow for questions to be asked and answered to ensure understanding. We discussed that he had taken the single most powerful action possible to decrease his risk of developing lung cancer when he quit smoking. I counseled him to remain smoke free, and to contact me if he ever had the desire to smoke again so that I can provide resources and tools to help support the effort to remain smoke free. We discussed the time and location of the scan, and that either  Isaac Miyamoto RN or I will call with the results within  24-48 hours of receiving them. He has my card and contact information in the event he needs to speak with me, in addition to a copy of the power point we reviewed as a resource. He verbalized understanding of all of the above and had no further questions upon leaving the office.     I explained to the patient that there has been a high incidence of coronary artery disease noted on these exams. I explained that this is a non-gated exam therefore degree or severity cannot be determined. This patient is on statin therapy. I have asked the patient to follow-up with their PCP regarding any incidental finding of coronary artery disease and management with diet or medication as they feel is clinically  indicated. The patient verbalized understanding of the above and had no further questions.     Bevelyn Ngo, NP 06/19/2018 10:28 AM

## 2018-06-20 ENCOUNTER — Telehealth: Payer: Self-pay | Admitting: Acute Care

## 2018-06-20 DIAGNOSIS — Z87891 Personal history of nicotine dependence: Secondary | ICD-10-CM

## 2018-06-20 DIAGNOSIS — Z122 Encounter for screening for malignant neoplasm of respiratory organs: Secondary | ICD-10-CM

## 2018-06-24 NOTE — Telephone Encounter (Signed)
Pt informed of CT results per Sarah Groce, NP.  PT verbalized understanding.  Copy sent to PCP.  Order placed for 1 yr f/u CT.  

## 2018-11-14 NOTE — Progress Notes (Signed)
Patient is here for follow up visit.  Subjective:   @Patient  ID: Isaac Casey, male    DOB: [birth year], 59 y.o.   MRN: [id number]  Chief Complaint  Patient presents with  . Shortness of Breath    8 week F/U  . Abnormal ECG     HPI  58 y/o Caucasian male with former smoking history, incidentally found to have coronary and aortic atherosclerosis, exertional dyspnea.  I had seen the patient for exertional dyspnea. His echocardiogram, similar to back in 2012, showed mildly reduced LVEF 45%. No evidence of ischemia or infarction on stress test. Coronary angiogram showed normal coronaries in 2012. I felt that it was unlikely that he now has obstructive CAD with otherwise unchanged symptoatology and LVEF. I suspected his exertional dyspnea may be more related to his h/o smoking and possible COPD. I recommended  PFT and pulmonology evaluation. In the meantime, I started him on low dose losartan to treat his mildly reduced LVEF and mildly elevated blood pressure. Given the question of asthma history, I avoided beta blocker.  Patient is here for 8 week follow up. He never saw pulmonologist. He still has some exertional dyspnea. He reports feeling short of breath 5 min into elliptical exercise, which eventually improves. He reports occasional wheezing. He denies chest pain. He is wearing CPAP at night and has had increased energy level during daytime.   Past Medical History:  Diagnosis Date  . GERD (gastroesophageal reflux disease)   . Hiatal hernia   . High cholesterol      Past Surgical History:  Procedure Laterality Date  . ANTERIOR CRUCIATE LIGAMENT REPAIR       Social History   Socioeconomic History  . Marital status: Married    Spouse name: Not on file  . Number of children: 5  . Years of education: Not on file  . Highest education level: Not on file  Occupational History  . Not on file  Social Needs  . Financial resource strain: Not on file  . Food insecurity:   Worry: Not on file    Inability: Not on file  . Transportation needs:    Medical: Not on file    Non-medical: Not on file  Tobacco Use  . Smoking status: Former Smoker    Packs/day: 1.00    Years: 38.00    Pack years: 38.00    Types: Cigarettes  . Smokeless tobacco: Never Used  . Tobacco comment: Pt has smoked in 3 years  Substance and Sexual Activity  . Alcohol use: Yes    Comment: Occasional "once a week"  . Drug use: No  . Sexual activity: Not on file  Lifestyle  . Physical activity:    Days per week: Not on file    Minutes per session: Not on file  . Stress: Not on file  Relationships  . Social connections:    Talks on phone: Not on file    Gets together: Not on file    Attends religious service: Not on file    Active member of club or organization: Not on file    Attends meetings of clubs or organizations: Not on file    Relationship status: Not on file  . Intimate partner violence:    Fear of current or ex partner: Not on file    Emotionally abused: Not on file    Physically abused: Not on file    Forced sexual activity: Not on file  Other Topics Concern  .  Not on file  Social History Narrative  . Not on file     Current Outpatient Medications on File Prior to Visit  Medication Sig Dispense Refill  . aspirin EC 81 MG tablet Take 81 mg by mouth daily.    . Cholecalciferol (VITAMIN D PO) Take by mouth.    Marland Kitchen ibuprofen (ADVIL,MOTRIN) 800 MG tablet Take 1 tablet (800 mg total) by mouth 3 (three) times daily. 21 tablet 0  . losartan (COZAAR) 25 MG tablet Take 1 tablet by mouth daily.    Marland Kitchen omega-3 acid ethyl esters (LOVAZA) 1 G capsule Take by mouth 2 (two) times daily.    Marland Kitchen omeprazole (PRILOSEC) 20 MG capsule Take 1 capsule by mouth daily.    Marland Kitchen SIMVASTATIN PO Take 40 mg by mouth.     . methocarbamol (ROBAXIN) 500 MG tablet Take 1 tablet (500 mg total) by mouth 2 (two) times daily. (Patient not taking: Reported on 11/15/2018) 20 tablet 0   No current  facility-administered medications on file prior to visit.     Cardiovascular studies:  Echocardiogram 08/13/2018: 1. Left ventricle cavity is normal in size. Mild concentric hypertrophy of the left ventricle. Mild decrease in global wall motion. Visual EF is approx. 50%. Normal diastolic filling pattern. Calculated EF 45%. 2. Trace mitral regurgitation. 3. Trace tricuspid regurgitation. 4. The aortic root is mildly dilated, measures 3.7 cm.  Exercise myoview stress 08/23/2018: 1. The resting electrocardiogram demonstrated normal sinus rhythm, incomplete RBBB, no resting arrhythmias and normal rest repolarization.  Stress EKG is negative for myocardial ischemia. With peak exercise there was 1 pair of Ventricular couplet and a single PVC.  There is no ST-T wave changes of ischemia. Patient exercised on Bruce protocol for 6:26 minutes and achieved  7.7 METS. Stress test terminated due to  Dyspnea and 87% MPHR achieved (Target HR >85%). Resting blood pressure 142/90 and peak blood pressure 200/84 mmHg. 2. Stress and rest SPECT images demonstrate homogeneous tracer distribution throughout the myocardium. Gated SPECT imaging reveals normal myocardial thickening and wall motion. The left ventricular ejection fraction was calculated at 39%, however visually appears normal.    3. This is an intermediate risk study, clinical correlation and echo correlation recommended.  CT chest lungs screening 06/19/2018: 1. Benign lung appearance or behavior.  Annual CT scan recommended. 2.  Aortic atherosclerosis, left main and left anterior descending coronary artery disease. 3.  Mild diffuse bronchial wall thickening and mild centrilobular and subcutaneous emphysema, suggestive of underlying COPD  Coronary angiogram 11/19/2010: Normal coronaries.   Review of Systems  Constitution: Negative for decreased appetite, malaise/fatigue, weight gain and weight loss.  HENT: Negative for congestion.   Eyes: Negative  for visual disturbance.  Cardiovascular: Negative for chest pain, dyspnea on exertion, leg swelling, palpitations and syncope.  Respiratory: Negative for shortness of breath.   Endocrine: Negative for cold intolerance.  Hematologic/Lymphatic: Does not bruise/bleed easily.  Skin: Negative for itching and rash.  Musculoskeletal: Negative for myalgias.  Gastrointestinal: Negative for abdominal pain, nausea and vomiting.  Genitourinary: Negative for dysuria.  Neurological: Negative for dizziness and weakness.  Psychiatric/Behavioral: The patient is not nervous/anxious.   All other systems reviewed and are negative.      Objective:    Vitals:   11/15/18 1414  BP: 118/78  Pulse: 81  SpO2: 95%     Physical Exam  Constitutional: He is oriented to person, place, and time. He appears well-developed and well-nourished. No distress.  HENT:  Head: Normocephalic and atraumatic.  Eyes: Pupils are equal, round, and reactive to light. Conjunctivae are normal.  Neck: No JVD present.  Cardiovascular: Normal rate, regular rhythm and intact distal pulses.  Pulmonary/Chest: Effort normal and breath sounds normal. He has no wheezes. He has no rales.  Abdominal: Soft. Bowel sounds are normal. There is no rebound.  Musculoskeletal:        General: No edema.  Lymphadenopathy:    He has no cervical adenopathy.  Neurological: He is alert and oriented to person, place, and time. No cranial nerve deficit.  Skin: Skin is warm and dry.  Psychiatric: He has a normal mood and affect.  Nursing note and vitals reviewed.       Assessment & Recommendations:   58 y/o Caucasian male with former smoking history, incidentally found to have coronary and aortic atherosclerosis, exertional dyspnea.  Exertional dyspnea: While he has mildly reduced LVEF 45%, this is unchanged between 2012 and 2019. He is clinically euvolumic. Symptoms are out of proportion to cardiac findings. Referred to pulmonary evaluation.   Mild aorta atherosclerosis, CAD without angina: Continue aspirin, statin.   I will see him back in 3 months.   Elder Negus, MD [facility] Pager: [phone] Office: [phone] If no answer Cell [phone]

## 2018-11-15 ENCOUNTER — Encounter: Payer: Self-pay | Admitting: Cardiology

## 2018-11-15 ENCOUNTER — Other Ambulatory Visit: Payer: Self-pay

## 2018-11-15 ENCOUNTER — Ambulatory Visit: Payer: Managed Care, Other (non HMO) | Admitting: Cardiology

## 2018-11-15 VITALS — BP 118/78 | HR 81 | Ht 73.0 in | Wt 197.0 lb

## 2018-11-15 DIAGNOSIS — J432 Centrilobular emphysema: Secondary | ICD-10-CM | POA: Insufficient documentation

## 2018-11-15 DIAGNOSIS — I2584 Coronary atherosclerosis due to calcified coronary lesion: Secondary | ICD-10-CM | POA: Insufficient documentation

## 2018-11-15 DIAGNOSIS — R0609 Other forms of dyspnea: Secondary | ICD-10-CM | POA: Diagnosis not present

## 2018-11-15 DIAGNOSIS — I2510 Atherosclerotic heart disease of native coronary artery without angina pectoris: Secondary | ICD-10-CM | POA: Diagnosis not present

## 2019-01-11 ENCOUNTER — Other Ambulatory Visit: Payer: Self-pay | Admitting: Cardiology

## 2019-01-11 DIAGNOSIS — R0609 Other forms of dyspnea: Secondary | ICD-10-CM

## 2019-01-13 NOTE — Telephone Encounter (Signed)
Please fill

## 2019-02-03 ENCOUNTER — Other Ambulatory Visit: Payer: Self-pay

## 2019-02-03 ENCOUNTER — Ambulatory Visit: Payer: Managed Care, Other (non HMO) | Admitting: Pulmonary Disease

## 2019-02-03 ENCOUNTER — Encounter: Payer: Self-pay | Admitting: Pulmonary Disease

## 2019-02-03 ENCOUNTER — Telehealth: Payer: Self-pay | Admitting: Pulmonary Disease

## 2019-02-03 DIAGNOSIS — J432 Centrilobular emphysema: Secondary | ICD-10-CM

## 2019-02-03 MED ORDER — ALBUTEROL SULFATE HFA 108 (90 BASE) MCG/ACT IN AERS: 2.0000 | INHALATION_SPRAY | Freq: Four times a day (QID) | RESPIRATORY_TRACT | 2 refills | Status: DC | PRN

## 2019-02-03 NOTE — Assessment & Plan Note (Signed)
Ideally we would proceed with a PFT at this time to further evaluate his exertional dyspnea.  But he will need COVID testing prior to doing PFTs hence will defer for now We will empirically try albuterol before activity  Prescription for albuterol MDI 2 puffs every 12 hours as needed with 2 refills Call me back in 1 month to report how this is working out.  If you do not feel any better we will proceed with breathing test and consider escalating bronchodilator therapy Follow-up screening CT chest in October 2020  I have encouraged him to stay active

## 2019-02-03 NOTE — Telephone Encounter (Signed)
LVM for Dr. Bard Herbert Kanji MD with [facility] today to return call back. X1  RA is requesting copy of stress test and echo results from Dr. Allyson Sabal office.

## 2019-02-03 NOTE — Progress Notes (Signed)
Subjective:    Patient ID: Isaac AgeeWilliam G Archambault, male    DOB: Apr 13, 1961, 58 y.o.   MRN: [id number]  HPI  Chief Complaint  Patient presents with  . pulm consult    Pt has cough-clear, SOB, seasonal allergies, with some wheezing. covid neg.    58 year old ex-smoker presents for evaluation of exertional dyspnea. 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  He has been developing exertional dyspnea for the past 1 year.  He underwent cardiology evaluation including treadmill stress test and echo and was told that he has a "weak heart".  He was started on losartan.  He denies overt paroxysmal nocturnal dyspnea orthopnea or pedal edema.  He leads an active lifestyle and prior to covered crisis was going to the gym and working on elliptical 3 times a week for about 45 minutes at a slow pace.  About 6 years ago he went to [facility] for dyspnea and he was given an MDI which really helped his breathing.  He reports dyspnea while walking uphill on a round of golf.  He can walk slowly at his own pace for unlimited duration. He denies wheezing or frequent attacks of bronchitis  He underwent CT screening 06/2018 and this was a RADS-2S with right upper lobe 5 mm nodule and mild centrilobular emphysema.  I personally reviewed images  He also has a diagnosis of OSA and uses nasal CPAP with nasal pillows and is compliant. He smoked about 30 pack years before he quit 3 years ago    Past Medical History:  Diagnosis Date  . GERD (gastroesophageal reflux disease)   . Hiatal hernia   . High cholesterol     Past Surgical History:  Procedure Laterality Date  . ANTERIOR CRUCIATE LIGAMENT REPAIR      Allergies  Allergen Reactions  . Wellbutrin [Bupropion] Itching    Social History   Socioeconomic History  . Marital status: Married    Spouse name: Not on file  . Number of children: 5  . Years of education: Not on file   . Highest education level: Not on file  Occupational History  . Not on file  Social Needs  . Financial resource strain: Not on file  . Food insecurity:    Worry: Not on file    Inability: Not on file  . Transportation needs:    Medical: Not on file    Non-medical: Not on file  Tobacco Use  . Smoking status: Former Smoker    Packs/day: 1.00    Years: 40.00    Pack years: 40.00    Types: Cigarettes    Last attempt to quit: 2016    Years since quitting: 4.4  . Smokeless tobacco: Never Used  . Tobacco comment: Pt has smoked in 3 years  Substance and Sexual Activity  . Alcohol use: Yes    Comment: Occasional "once a week"  . Drug use: No  . Sexual activity: Not on file  Lifestyle  . Physical activity:    Days per week: Not on file    Minutes per session: Not on file  . Stress: Not on file  Relationships  . Social connections:    Talks on phone: Not on file    Gets together: Not on file    Attends religious service: Not on file    Active member of club or organization: Not on file  Attends meetings of clubs or organizations: Not on file    Relationship status: Not on file  . Intimate partner violence:    Fear of current or ex partner: Not on file    Emotionally abused: Not on file    Physically abused: Not on file    Forced sexual activity: Not on file  Other Topics Concern  . Not on file  Social History Narrative  . Not on file     Family history of cancer in his mother and heart disease in his father at age 69    Review of Systems Constitutional: negative for anorexia, fevers and sweats  Eyes: negative for irritation, redness and visual disturbance  Ears, nose, mouth, throat, and face: negative for earaches, epistaxis, nasal congestion and sore throat  Respiratory: negative for cough,  sputum and wheezing  Cardiovascular: negative for chest pain,  lower extremity edema, orthopnea, palpitations and syncope  Gastrointestinal: negative for abdominal pain,  constipation, diarrhea, melena, nausea and vomiting  Genitourinary:negative for dysuria, frequency and hematuria  Hematologic/lymphatic: negative for bleeding, easy bruising and lymphadenopathy  Musculoskeletal:negative for arthralgias, muscle weakness and stiff joints  Neurological: negative for coordination problems, gait problems, headaches and weakness  Endocrine: negative for diabetic symptoms including polydipsia, polyuria and weight loss     Objective:   Physical Exam  Gen. Pleasant, well-nourished, in no distress, normal affect ENT - no pallor,icterus, no post nasal drip Neck: No JVD, no thyromegaly, no carotid bruits Lungs: no use of accessory muscles, no dullness to percussion, clear without rales or rhonchi  Cardiovascular: Rhythm regular, heart sounds  normal, no murmurs or gallops, no peripheral edema Abdomen: soft and non-tender, no hepatosplenomegaly, BS normal. Musculoskeletal: No deformities, no cyanosis or clubbing Neuro:  alert, non focal       Assessment & Plan:

## 2019-02-03 NOTE — Patient Instructions (Signed)
  Prescription for albuterol MDI 2 puffs every 12 hours as needed with 2 refills Call me back in 1 month to report how this is working out.  If you do not feel any better we will proceed with breathing test. Follow-up screening CT chest in October 2020

## 2019-05-22 ENCOUNTER — Ambulatory Visit: Payer: Managed Care, Other (non HMO) | Admitting: Cardiology

## 2019-05-22 ENCOUNTER — Encounter: Payer: Self-pay | Admitting: Cardiology

## 2019-05-22 ENCOUNTER — Other Ambulatory Visit: Payer: Self-pay

## 2019-05-22 VITALS — BP 107/70 | HR 86 | Temp 97.3°F | Ht 72.0 in | Wt 193.4 lb

## 2019-05-22 DIAGNOSIS — I2510 Atherosclerotic heart disease of native coronary artery without angina pectoris: Secondary | ICD-10-CM

## 2019-05-22 DIAGNOSIS — R0609 Other forms of dyspnea: Secondary | ICD-10-CM | POA: Diagnosis not present

## 2019-05-22 NOTE — Progress Notes (Signed)
Patient is here for follow up visit.  Subjective:   @Patient  ID: Isaac Casey, male    DOB: 1960-11-03, 58 y.o.   MRN: [id number]   Chief Complaint  Patient presents with  . Follow-up    6 month  . Coronary Artery Disease     HPI  58 y/o Caucasian male with former smoking history, coronary and aortic atherosclerosis, exertional dyspnea.  I had seen the patient for exertional dyspnea. His echocardiogram, similar to back in 2012, showed mildly reduced LVEF 45%. No evidence of ischemia or infarction on stress test. Coronary angiogram showed normal coronaries in 2012. I felt that it was unlikely that he now has obstructive CAD with otherwise unchanged symptoatology and LVEF. I suspected his exertional dyspnea may be more related to his h/o smoking and possible COPD. I recommended  PFT and pulmonology evaluation. In the meantime, I started him on low dose losartan to treat his mildly reduced LVEF and mildly elevated blood pressure. Given the question of asthma history, I avoided beta blocker. Patient was seen by Dr. Elsworth Soho with [facility] pulmonology in 02/2019 for his exertional dyspnea.  While PFT was felt warranted, and was deferred due to COVID related restrictions.  He was empirically started on albuterol to be used prior to physical activity.  Patient is currently using albuterol before his usual nebulizer walks 4 times a week.  He has noticed significant improvement while walking up the hill when he uses albuterol inhaler.  Past Medical History:  Diagnosis Date  . GERD (gastroesophageal reflux disease)   . Hiatal hernia   . High cholesterol      Past Surgical History:  Procedure Laterality Date  . ANTERIOR CRUCIATE LIGAMENT REPAIR       Social History   Socioeconomic History  . Marital status: Married    Spouse name: Not on file  . Number of children: 5  . Years of education: Not on file  . Highest education level: Not on file  Occupational History  . Not on file   Social Needs  . Financial resource strain: Not on file  . Food insecurity    Worry: Not on file    Inability: Not on file  . Transportation needs    Medical: Not on file    Non-medical: Not on file  Tobacco Use  . Smoking status: Former Smoker    Packs/day: 1.00    Years: 40.00    Pack years: 40.00    Types: Cigarettes    Quit date: 2016    Years since quitting: 4.7  . Smokeless tobacco: Never Used  . Tobacco comment: Pt has smoked in 3 years  Substance and Sexual Activity  . Alcohol use: Yes    Comment: Occasional "once a week"  . Drug use: No  . Sexual activity: Not on file  Lifestyle  . Physical activity    Days per week: Not on file    Minutes per session: Not on file  . Stress: Not on file  Relationships  . Social Herbalist on phone: Not on file    Gets together: Not on file    Attends religious service: Not on file    Active member of club or organization: Not on file    Attends meetings of clubs or organizations: Not on file    Relationship status: Not on file  . Intimate partner violence    Fear of current or ex partner: Not on file  Emotionally abused: Not on file    Physically abused: Not on file    Forced sexual activity: Not on file  Other Topics Concern  . Not on file  Social History Narrative  . Not on file     Current Outpatient Medications on File Prior to Visit  Medication Sig Dispense Refill  . albuterol (VENTOLIN HFA) 108 (90 Base) MCG/ACT inhaler Inhale 2 puffs into the lungs every 6 (six) hours as needed for wheezing or shortness of breath. 1 Inhaler 2  . aspirin EC 81 MG tablet Take 81 mg by mouth daily.    . Cholecalciferol (VITAMIN D PO) Take by mouth.    Marland Kitchen ibuprofen (ADVIL,MOTRIN) 800 MG tablet Take 1 tablet (800 mg total) by mouth 3 (three) times daily. 21 tablet 0  . losartan (COZAAR) 25 MG tablet TAKE 1 TABLET BY MOUTH EVERY DAY 90 tablet 1  . methocarbamol (ROBAXIN) 500 MG tablet Take 1 tablet (500 mg total) by mouth 2  (two) times daily. 20 tablet 0  . omega-3 acid ethyl esters (LOVAZA) 1 G capsule Take by mouth 2 (two) times daily.    Marland Kitchen omeprazole (PRILOSEC) 20 MG capsule Take 1 capsule by mouth daily.    Marland Kitchen SIMVASTATIN PO Take 40 mg by mouth.      No current facility-administered medications on file prior to visit.     Cardiovascular studies:  EKG 05/22/2019: Sinus rhythm 77 bpm Normal EKG  Echocardiogram 08/13/2018: 1. Left ventricle cavity is normal in size. Mild concentric hypertrophy of the left ventricle. Mild decrease in global wall motion. Visual EF is approx. 50%. Normal diastolic filling pattern. Calculated EF 45%. 2. Trace mitral regurgitation. 3. Trace tricuspid regurgitation. 4. The aortic root is mildly dilated, measures 3.7 cm.  Exercise myoview stress 08/23/2018: 1. The resting electrocardiogram demonstrated normal sinus rhythm, incomplete RBBB, no resting arrhythmias and normal rest repolarization.  Stress EKG is negative for myocardial ischemia. With peak exercise there was 1 pair of Ventricular couplet and a single PVC.  There is no ST-T wave changes of ischemia. Patient exercised on Bruce protocol for 6:26 minutes and achieved  7.7 METS. Stress test terminated due to  Dyspnea and 87% MPHR achieved (Target HR >85%). Resting blood pressure 142/90 and peak blood pressure 200/84 mmHg. 2. Stress and rest SPECT images demonstrate homogeneous tracer distribution throughout the myocardium. Gated SPECT imaging reveals normal myocardial thickening and wall motion. The left ventricular ejection fraction was calculated at 39%, however visually appears normal.    3. This is an intermediate risk study, clinical correlation and echo correlation recommended.  CT chest lungs screening 06/19/2018: 1. Benign lung appearance or behavior.  Annual CT scan recommended. 2.  Aortic atherosclerosis, left main and left anterior descending coronary artery disease. 3.  Mild diffuse bronchial wall thickening  and mild centrilobular and subcutaneous emphysema, suggestive of underlying COPD  Coronary angiogram 11/19/2010: Normal coronaries.   Review of Systems  Constitution: Negative for decreased appetite, malaise/fatigue, weight gain and weight loss.  HENT: Negative for congestion.   Eyes: Negative for visual disturbance.  Cardiovascular: Positive for dyspnea on exertion (Improved). Negative for chest pain, leg swelling, palpitations and syncope.  Respiratory: Positive for shortness of breath (Improved).   Endocrine: Negative for cold intolerance.  Hematologic/Lymphatic: Does not bruise/bleed easily.  Skin: Negative for itching and rash.  Musculoskeletal: Negative for myalgias.  Gastrointestinal: Negative for abdominal pain, nausea and vomiting.  Genitourinary: Negative for dysuria.  Neurological: Negative for dizziness and weakness.  Psychiatric/Behavioral:  The patient is not nervous/anxious.   All other systems reviewed and are negative.      Objective:    Vitals:   05/22/19 1508  BP: 107/70  Pulse: 86  Temp: (!) 97.3 F (36.3 C)  SpO2: 96%     Physical Exam  Constitutional: He is oriented to person, place, and time. He appears well-developed and well-nourished. No distress.  HENT:  Head: Normocephalic and atraumatic.  Eyes: Pupils are equal, round, and reactive to light. Conjunctivae are normal.  Neck: No JVD present.  Cardiovascular: Normal rate, regular rhythm and intact distal pulses.  No murmur heard. Pulmonary/Chest: Effort normal and breath sounds normal. He has no wheezes. He has no rales.  Abdominal: Soft. Bowel sounds are normal. There is no rebound.  Musculoskeletal:        General: Edema (Trace) present.  Lymphadenopathy:    He has no cervical adenopathy.  Neurological: He is alert and oriented to person, place, and time. No cranial nerve deficit.  Skin: Skin is warm and dry.  Psychiatric: He has a normal mood and affect.  Nursing note and vitals  reviewed.       Assessment & Recommendations:   58 y/o Caucasian male with former smoking history, coronary and aortic atherosclerosis, exertional dyspnea.  Exertional dyspnea: Probably exercise induced asthma or COPD. Continue follow up with Dr. Vassie LollAlva.   Mildly reduced EF: Continue losartan.   Mild aorta atherosclerosis, CAD without angina: Continue aspirin, statin.   F/u in 1 year  Elder NegusManish J Claudett Bayly, MD [facility] Pager: [phone] Office: [phone] If no answer Cell [phone]

## 2019-07-08 ENCOUNTER — Ambulatory Visit (INDEPENDENT_AMBULATORY_CARE_PROVIDER_SITE_OTHER)
Admission: RE | Admit: 2019-07-08 | Discharge: 2019-07-08 | Disposition: A | Discharge status: Home / Self Care | Payer: Managed Care, Other (non HMO) | Source: Ambulatory Visit | Attending: Acute Care | Admitting: Acute Care

## 2019-07-08 ENCOUNTER — Encounter (INDEPENDENT_AMBULATORY_CARE_PROVIDER_SITE_OTHER): Payer: Self-pay

## 2019-07-08 ENCOUNTER — Other Ambulatory Visit: Payer: Self-pay

## 2019-07-08 DIAGNOSIS — Z122 Encounter for screening for malignant neoplasm of respiratory organs: Secondary | ICD-10-CM

## 2019-07-08 DIAGNOSIS — Z87891 Personal history of nicotine dependence: Secondary | ICD-10-CM

## 2019-07-11 ENCOUNTER — Other Ambulatory Visit: Payer: Self-pay | Admitting: Cardiology

## 2019-07-11 DIAGNOSIS — R0609 Other forms of dyspnea: Secondary | ICD-10-CM

## 2019-07-17 ENCOUNTER — Telehealth: Payer: Self-pay | Admitting: Acute Care

## 2019-07-17 NOTE — Telephone Encounter (Signed)
LMTC x 1  

## 2019-07-21 NOTE — Telephone Encounter (Signed)
LMTC x 1  

## 2019-07-22 ENCOUNTER — Telehealth: Payer: Self-pay | Admitting: Acute Care

## 2019-07-22 DIAGNOSIS — Z87891 Personal history of nicotine dependence: Secondary | ICD-10-CM

## 2019-07-22 DIAGNOSIS — Z122 Encounter for screening for malignant neoplasm of respiratory organs: Secondary | ICD-10-CM

## 2019-07-23 NOTE — Telephone Encounter (Signed)
Pt informed of CT results per Sarah Groce, NP.  PT verbalized understanding.  Copy sent to PCP.  Order placed for 1 yr f/u CT.  

## 2019-07-23 NOTE — Telephone Encounter (Signed)
See telephone note 07/22/19

## 2019-10-08 ENCOUNTER — Other Ambulatory Visit: Payer: Self-pay

## 2019-10-08 DIAGNOSIS — R0609 Other forms of dyspnea: Secondary | ICD-10-CM

## 2019-10-08 MED ORDER — LOSARTAN POTASSIUM 25 MG PO TABS: 25.0000 mg | ORAL_TABLET | Freq: Every day | ORAL | 2 refills | Status: DC

## 2019-10-08 MED ORDER — LOSARTAN POTASSIUM 25 MG PO TABS: 25.0000 mg | ORAL_TABLET | Freq: Every day | ORAL | 1 refills | Status: DC

## 2019-10-08 NOTE — Telephone Encounter (Signed)
Refill on losartan sent in

## 2019-10-08 NOTE — Telephone Encounter (Signed)
Ok done

## 2020-01-20 ENCOUNTER — Telehealth: Payer: Self-pay

## 2020-01-20 ENCOUNTER — Ambulatory Visit: Payer: Managed Care, Other (non HMO) | Admitting: Pulmonary Disease

## 2020-01-20 ENCOUNTER — Encounter: Payer: Self-pay | Admitting: Pulmonary Disease

## 2020-01-20 ENCOUNTER — Other Ambulatory Visit: Payer: Self-pay

## 2020-01-20 DIAGNOSIS — R911 Solitary pulmonary nodule: Secondary | ICD-10-CM

## 2020-01-20 DIAGNOSIS — J432 Centrilobular emphysema: Secondary | ICD-10-CM

## 2020-01-20 MED ORDER — ANORO ELLIPTA 62.5-25 MCG/INH IN AEPB: 1.0000 | INHALATION_SPRAY | Freq: Every day | RESPIRATORY_TRACT | 0 refills | Status: DC

## 2020-01-20 NOTE — Assessment & Plan Note (Signed)
Stable on 1 year follow-up, likely benign. Continue annual low-dose screening CT

## 2020-01-20 NOTE — Telephone Encounter (Signed)
Please run a insurance check on this pt's plan for Anoro.

## 2020-01-20 NOTE — Assessment & Plan Note (Signed)
No relief with albuterol We will trial LABA/LAMA combination -sample of Anoro given, he will call us back for prescription if this works  Schedule PFTs to quantitate lung function

## 2020-01-20 NOTE — Progress Notes (Signed)
   Subjective:    Patient ID: Isaac Casey, male    DOB: [birth year], 59 y.o.   MRN: [id number]  HPI  59 yo for FU of emphysema &  Dyspnea He smoked more than 40 pack years before he quit in 2016  Cardiac testing -[facility]-was within limits per report.  He was given albuterol MDI last visit, this only helps a little bit.  He still remains short of breath while climbing up a hill.  He reports URI few weeks ago and felt quite sick, he is now recovered, denies sputum production or wheezing. He was told as a child that he has chronic bronchitis  Significant tests/ events reviewed  LDCT 06/2019  RADS 2 , Pulmonary nodules measure 4.1 mm or less in size   CT screening 06/2018 and this was a RADS-2S with right upper lobe 5 mm nodule and mild centrilobular emphysema.  Review of Systems Patient denies significant dyspnea,cough, hemoptysis,  chest pain, palpitations, pedal edema, orthopnea, paroxysmal nocturnal dyspnea, lightheadedness, nausea, vomiting, abdominal or  leg pains      Objective:   Physical Exam  Gen. Pleasant, well-nourished, in no distress ENT - no thrush, no pallor/icterus,no post nasal drip Neck: No JVD, no thyromegaly, no carotid bruits Lungs: no use of accessory muscles, no dullness to percussion, clear without rales or rhonchi  Cardiovascular: Rhythm regular, heart sounds  normal, no murmurs or gallops, no peripheral edema Musculoskeletal: No deformities, no cyanosis or clubbing        Assessment & Plan:

## 2020-01-20 NOTE — Patient Instructions (Signed)
Sample of Anoro -take once daily, call me for prescription if this works. Schedule PFTs, you will need Covid testing prior to this

## 2020-01-21 NOTE — Telephone Encounter (Signed)
Thank you. I spoke with pt to make him aware. Nothing further is needed.

## 2020-01-21 NOTE — Telephone Encounter (Signed)
Ran test claim, 1 month of Anoro through patient's plan is $50.00.

## 2020-01-23 ENCOUNTER — Other Ambulatory Visit (HOSPITAL_COMMUNITY)
Admission: RE | Admit: 2020-01-23 | Discharge: 2020-01-23 | Disposition: A | Discharge status: Home / Self Care | Payer: Managed Care, Other (non HMO) | Source: Ambulatory Visit | Attending: Cardiology | Admitting: Cardiology

## 2020-01-23 DIAGNOSIS — Z20822 Contact with and (suspected) exposure to covid-19: Secondary | ICD-10-CM | POA: Insufficient documentation

## 2020-01-23 DIAGNOSIS — Z01812 Encounter for preprocedural laboratory examination: Secondary | ICD-10-CM | POA: Insufficient documentation

## 2020-01-23 LAB — SARS CORONAVIRUS 2 (TAT 6-24 HRS): SARS Coronavirus 2: NEGATIVE

## 2020-01-26 ENCOUNTER — Other Ambulatory Visit: Payer: Self-pay | Admitting: *Deleted

## 2020-01-26 DIAGNOSIS — J432 Centrilobular emphysema: Secondary | ICD-10-CM

## 2020-01-26 NOTE — Progress Notes (Signed)
pft  

## 2020-01-27 ENCOUNTER — Other Ambulatory Visit: Payer: Self-pay

## 2020-01-27 ENCOUNTER — Ambulatory Visit (INDEPENDENT_AMBULATORY_CARE_PROVIDER_SITE_OTHER): Payer: Managed Care, Other (non HMO) | Admitting: Pulmonary Disease

## 2020-01-27 DIAGNOSIS — J432 Centrilobular emphysema: Secondary | ICD-10-CM

## 2020-01-27 LAB — PULMONARY FUNCTION TEST
DL/VA % pred: 59 %
DL/VA: 2.49 ml/min/mmHg/L
DLCO cor % pred: 68 %
DLCO cor: 20.1 ml/min/mmHg
DLCO unc % pred: 68 %
DLCO unc: 20.1 ml/min/mmHg
FEF 25-75 Post: 1.41 L/sec
FEF 25-75 Pre: 1.14 L/sec
FEF2575-%Change-Post: 23 %
FEF2575-%Pred-Post: 44 %
FEF2575-%Pred-Pre: 36 %
FEV1-%Change-Post: 6 %
FEV1-%Pred-Post: 68 %
FEV1-%Pred-Pre: 64 %
FEV1-Post: 2.65 L
FEV1-Pre: 2.48 L
FEV1FVC-%Change-Post: -4 %
FEV1FVC-%Pred-Pre: 75 %
FEV6-%Change-Post: 12 %
FEV6-%Pred-Post: 97 %
FEV6-%Pred-Pre: 86 %
FEV6-Post: 4.78 L
FEV6-Pre: 4.25 L
FEV6FVC-%Change-Post: 0 %
FEV6FVC-%Pred-Post: 103 %
FEV6FVC-%Pred-Pre: 102 %
FVC-%Change-Post: 12 %
FVC-%Pred-Post: 94 %
FVC-%Pred-Pre: 84 %
FVC-Post: 4.85 L
FVC-Pre: 4.33 L
Post FEV1/FVC ratio: 55 %
Post FEV6/FVC ratio: 99 %
Pre FEV1/FVC ratio: 57 %
Pre FEV6/FVC Ratio: 98 %

## 2020-01-27 NOTE — Progress Notes (Signed)
Full PFT performed today. °

## 2020-05-20 ENCOUNTER — Ambulatory Visit: Payer: Managed Care, Other (non HMO) | Admitting: Cardiology

## 2020-05-20 ENCOUNTER — Other Ambulatory Visit: Payer: Self-pay

## 2020-05-20 ENCOUNTER — Encounter: Payer: Self-pay | Admitting: Cardiology

## 2020-05-20 VITALS — BP 117/80 | HR 80 | Resp 16 | Ht 72.0 in | Wt 195.0 lb

## 2020-05-20 DIAGNOSIS — R600 Localized edema: Secondary | ICD-10-CM

## 2020-05-20 DIAGNOSIS — I2510 Atherosclerotic heart disease of native coronary artery without angina pectoris: Secondary | ICD-10-CM

## 2020-05-20 MED ORDER — FUROSEMIDE 20 MG PO TABS: 20.0000 mg | ORAL_TABLET | ORAL | 3 refills | Status: DC | PRN

## 2020-05-20 NOTE — Progress Notes (Signed)
Patient is here for follow up visit.  Subjective:   @Patient  ID: , male    DOB: 1961/01/28, 59 y.o.   MRN: [id number]   Chief Complaint  Patient presents with  . Coronary Artery Disease  . Shortness of Breath  . Follow-up    1 year     HPI  59 y/o Caucasian male with former smoking history, coronary and aortic atherosclerosis, exertional dyspnea.  Patient denies any chest pain, has stable exertional dyspnea with more than usual activity-such as gym workout. He is being treated for COPD by Dr 67. Cancer screening CT scan showed aorta and coronary atherosclerosis/caclficaition.     Current Outpatient Medications on File Prior to Visit  Medication Sig Dispense Refill  . albuterol (VENTOLIN HFA) 108 (90 Base) MCG/ACT inhaler Inhale 2 puffs into the lungs every 6 (six) hours as needed for wheezing or shortness of breath. 1 Inhaler 2  . aspirin EC 81 MG tablet Take 81 mg by mouth daily.    . Cholecalciferol (VITAMIN D PO) Take by mouth.    Vassie Loll ibuprofen (ADVIL,MOTRIN) 800 MG tablet Take 1 tablet (800 mg total) by mouth 3 (three) times daily. 21 tablet 0  . losartan (COZAAR) 25 MG tablet Take 25 mg by mouth daily.    Marland Kitchen omega-3 acid ethyl esters (LOVAZA) 1 G capsule Take by mouth 2 (two) times daily.    Marland Kitchen omeprazole (PRILOSEC) 20 MG capsule Take 1 capsule by mouth daily.    . sildenafil (VIAGRA) 100 MG tablet Take 1 tablet by mouth as needed.    . simvastatin (ZOCOR) 40 MG tablet Take 40 mg by mouth daily.    Marland Kitchen umeclidinium-vilanterol (ANORO ELLIPTA) 62.5-25 MCG/INH AEPB Inhale 1 puff into the lungs daily. 2 each 0   No current facility-administered medications on file prior to visit.    Cardiovascular studies:  EKG 05/20/2020: Sinus rhythm 77 bpm Normal EKG    Echocardiogram 08/13/2018: 1. Left ventricle cavity is normal in size. Mild concentric hypertrophy of the left ventricle. Mild decrease in global wall motion. Visual EF is approx. 50%. Normal  diastolic filling pattern. Calculated EF 45%. 2. Trace mitral regurgitation. 3. Trace tricuspid regurgitation. 4. The aortic root is mildly dilated, measures 3.7 cm.  Exercise myoview stress 08/23/2018: 1. The resting electrocardiogram demonstrated normal sinus rhythm, incomplete RBBB, no resting arrhythmias and normal rest repolarization.  Stress EKG is negative for myocardial ischemia. With peak exercise there was 1 pair of Ventricular couplet and a single PVC.  There is no ST-T wave changes of ischemia. Patient exercised on Bruce protocol for 6:26 minutes and achieved  7.7 METS. Stress test terminated due to  Dyspnea and 87% MPHR achieved (Target HR >85%). Resting blood pressure 142/90 and peak blood pressure 200/84 mmHg. 2. Stress and rest SPECT images demonstrate homogeneous tracer distribution throughout the myocardium. Gated SPECT imaging reveals normal myocardial thickening and wall motion. The left ventricular ejection fraction was calculated at 39%, however visually appears normal.    3. This is an intermediate risk study, clinical correlation and echo correlation recommended.  CT chest lungs screening 06/19/2018: 1. Benign lung appearance or behavior.  Annual CT scan recommended. 2.  Aortic atherosclerosis, left main and left anterior descending coronary artery disease. 3.  Mild diffuse bronchial wall thickening and mild centrilobular and subcutaneous emphysema, suggestive of underlying COPD  Coronary angiogram 11/19/2010: Normal coronaries.   Review of Systems  Cardiovascular: Positive for dyspnea on exertion. Negative for chest pain, leg  swelling, palpitations and syncope.       Objective:    Vitals:   05/20/20 1220  BP: 117/80  Pulse: 80  Resp: 16  SpO2: 98%     Physical Exam Vitals and nursing note reviewed.  Constitutional:      General: He is not in acute distress. Neck:     Vascular: No JVD.  Cardiovascular:     Rate and Rhythm: Normal rate and regular  rhythm.     Heart sounds: Normal heart sounds. No murmur heard.   Pulmonary:     Effort: Pulmonary effort is normal.     Breath sounds: Normal breath sounds. No wheezing or rales.         Assessment & Recommendations:   59 y/o Caucasian male with former smoking history, coronary and aortic atherosclerosis, exertional dyspnea.  Exertional dyspnea: Most likely due to COPD. Continue follow up with Dr. Vassie Loll.   Mildly reduced EF: Continue losartan. Will repeat echocardiogram. Use lasix prn for leg edema  Mild aorta atherosclerosis, CAD without angina: Continue aspirin, statin.  Check lipid panel  F/u in 1 year  Elder Negus, MD [facility] Pager: [phone] Office: [phone] If no answer Cell [phone]

## 2020-05-21 ENCOUNTER — Ambulatory Visit: Payer: Managed Care, Other (non HMO) | Admitting: Cardiology

## 2020-05-22 LAB — LIPID PANEL
Chol/HDL Ratio: 2.8 ratio (ref 0.0–5.0)
Cholesterol, Total: 139 mg/dL (ref 100–199)
HDL: 50 mg/dL (ref >39)
LDL Chol Calc (NIH): 68 mg/dL (ref 0–99)
Triglycerides: 118 mg/dL (ref 0–149)
VLDL Cholesterol Cal: 21 mg/dL (ref 5–40)

## 2020-05-22 LAB — BASIC METABOLIC PANEL
BUN/Creatinine Ratio: 18 (ref 10–24)
BUN: 19 mg/dL (ref 8–27)
CO2: 23 mmol/L (ref 20–29)
Calcium: 9.7 mg/dL (ref 8.6–10.2)
Chloride: 103 mmol/L (ref 96–106)
Creatinine, Ser: 1.08 mg/dL (ref 0.76–1.27)
GFR calc Af Amer: 86 mL/min/{1.73_m2} (ref >59)
GFR calc non Af Amer: 74 mL/min/{1.73_m2} (ref >59)
Glucose: 103 mg/dL — ABNORMAL HIGH (ref 65–99)
Potassium: 5 mmol/L (ref 3.5–5.2)
Sodium: 137 mmol/L (ref 134–144)

## 2020-05-22 LAB — BRAIN NATRIURETIC PEPTIDE: BNP: 15.8 pg/mL (ref 0.0–100.0)

## 2020-05-24 NOTE — Progress Notes (Signed)
Called patient, NA, LMAM

## 2020-05-24 NOTE — Progress Notes (Signed)
2nd attempt spoke to patient and he is aware

## 2020-05-24 NOTE — Progress Notes (Signed)
Patient didn't answer will try again later

## 2020-05-26 ENCOUNTER — Ambulatory Visit: Payer: Managed Care, Other (non HMO)

## 2020-05-26 ENCOUNTER — Other Ambulatory Visit: Payer: Self-pay

## 2020-05-26 DIAGNOSIS — R600 Localized edema: Secondary | ICD-10-CM

## 2020-08-02 ENCOUNTER — Ambulatory Visit (INDEPENDENT_AMBULATORY_CARE_PROVIDER_SITE_OTHER)
Admission: RE | Admit: 2020-08-02 | Discharge: 2020-08-02 | Disposition: A | Discharge status: Home / Self Care | Payer: Managed Care, Other (non HMO) | Source: Ambulatory Visit | Attending: Cardiology | Admitting: Cardiology

## 2020-08-02 ENCOUNTER — Other Ambulatory Visit: Payer: Self-pay

## 2020-08-02 DIAGNOSIS — Z87891 Personal history of nicotine dependence: Secondary | ICD-10-CM | POA: Diagnosis not present

## 2020-08-02 DIAGNOSIS — Z122 Encounter for screening for malignant neoplasm of respiratory organs: Secondary | ICD-10-CM

## 2020-08-09 ENCOUNTER — Other Ambulatory Visit: Payer: Self-pay | Admitting: *Deleted

## 2020-08-09 DIAGNOSIS — Z87891 Personal history of nicotine dependence: Secondary | ICD-10-CM

## 2020-08-09 NOTE — Progress Notes (Signed)
Please call patient and let them  know their  low dose Ct was read as a.Please let them  know we will order and schedule their  annual screening scan for 07/2021. Please let them  know there was notation of CAD on their  scan.  Please remind the patient  that this is a non-gated exam therefore degree or severity of disease  cannot be determined. Please have them  follow up with their PCP regarding potential risk factor modification, dietary therapy or pharmacologic therapy if clinically indicated. Pt.  is  currently on statin therapy. Please place order for annual  screening scan for  07/2021 and fax results to PCP. Thanks so much.

## 2020-08-14 ENCOUNTER — Other Ambulatory Visit: Payer: Self-pay | Admitting: Cardiology

## 2020-08-14 DIAGNOSIS — R600 Localized edema: Secondary | ICD-10-CM

## 2020-12-31 ENCOUNTER — Other Ambulatory Visit: Payer: Self-pay | Admitting: Cardiology

## 2020-12-31 DIAGNOSIS — R0609 Other forms of dyspnea: Secondary | ICD-10-CM

## 2021-01-22 ENCOUNTER — Other Ambulatory Visit: Payer: Self-pay | Admitting: Cardiology

## 2021-01-22 DIAGNOSIS — R600 Localized edema: Secondary | ICD-10-CM

## 2021-05-20 ENCOUNTER — Ambulatory Visit: Payer: Managed Care, Other (non HMO) | Admitting: Cardiology

## 2021-05-24 ENCOUNTER — Ambulatory Visit: Payer: 59 | Admitting: Cardiology

## 2021-05-31 ENCOUNTER — Ambulatory Visit: Payer: 59 | Admitting: Cardiology

## 2021-06-08 NOTE — Progress Notes (Addendum)
Patient is here for follow up visit.  Subjective:   @Patient  ID: Isaac Casey, male    DOB: 1961-03-29, 60 y.o.   MRN: [id number]   Chief Complaint  Patient presents with   Coronary artery disease involving native coronary artery of   Follow-up    1 year     HPI  60 y/o Caucasian male with former smoking history, coronary and aortic atherosclerosis, mildly reduced EF without heart failure symptoms, now with possible angina symptoms.  Patient has historically had mildly reduced EF without any heart failure symptoms. Along with coronary calcification including in left main, as noted on cancer screening CT scan.   Patient denies any chest pain, has stable exertional dyspnea with more than usual activity-such as gym workout. He is being treated for COPD by Dr Elsworth Soho. Cancer screening CT scan showed aorta and coronary atherosclerosis/caclficaition.    Few weeks ago, patient had 2 episodes of left-sided chest pain that occurred after walking and then resting during a golf tournament. Patient has not experienced similar pains while walking in the neighborhood since then.   Current Outpatient Medications on File Prior to Visit  Medication Sig Dispense Refill   albuterol (VENTOLIN HFA) 108 (90 Base) MCG/ACT inhaler Inhale 2 puffs into the lungs every 6 (six) hours as needed for wheezing or shortness of breath. 1 Inhaler 2   aspirin EC 81 MG tablet Take 81 mg by mouth daily.     Cholecalciferol (VITAMIN D PO) Take by mouth.     furosemide (LASIX) 20 MG tablet TAKE 1 TABLET BY MOUTH EVERY DAY AS NEEDED 90 tablet 1   ibuprofen (ADVIL,MOTRIN) 800 MG tablet Take 1 tablet (800 mg total) by mouth 3 (three) times daily. 21 tablet 0   losartan (COZAAR) 25 MG tablet TAKE 1 TABLET BY MOUTH EVERY DAY 90 tablet 1   omega-3 acid ethyl esters (LOVAZA) 1 G capsule Take by mouth 2 (two) times daily.     omeprazole (PRILOSEC) 20 MG capsule Take 1 capsule by mouth daily.     sildenafil (VIAGRA) 100 MG  tablet Take 1 tablet by mouth as needed.     simvastatin (ZOCOR) 40 MG tablet Take 40 mg by mouth daily.     No current facility-administered medications on file prior to visit.    Cardiovascular studies:  EKG 06/09/2021: Sinus rhythm 73 bpm Normal EKG  CT chest 07/2020: 1. Lung-RADS 2, benign appearance or behavior. 2. Coronary artery calcifications noted. Aortic Atherosclerosis (ICD10-I70.0) and Emphysema (ICD10-J43.9).  Echocardiogram 05/26/2020:  Left ventricle cavity is normal in size. Mild concentric hypertrophy of  the left ventricle. Mild global hypokinesis. LVEF 40-45%. Normal diastolic  filling pattern.  Mild (Grade I) mitral regurgitation.  Mild tricuspid regurgitation.  No evidence of pulmonary hypertension.  On direct comparison of images from study in 2019, there is no significant  change in LVEF.   Exercise myoview stress 08/23/2018: 1. The resting electrocardiogram demonstrated normal sinus rhythm, incomplete RBBB, no resting arrhythmias and normal rest repolarization.  Stress EKG is negative for myocardial ischemia. With peak exercise there was 1 pair of Ventricular couplet and a single PVC.  There is no ST-T wave changes of ischemia. Patient exercised on Bruce protocol for 6:26 minutes and achieved  7.7 METS. Stress test terminated due to  Dyspnea and 87% MPHR achieved (Target HR >85%). Resting blood pressure 142/90 and peak blood pressure 200/84 mmHg. 2. Stress and rest SPECT images demonstrate homogeneous tracer distribution throughout the myocardium. Gated  SPECT imaging reveals normal myocardial thickening and wall motion. The left ventricular ejection fraction was calculated at 39%, however visually appears normal.    3. This is an intermediate risk study, clinical correlation and echo correlation recommended.  CT chest lungs screening 06/19/2018: 1. Benign lung appearance or behavior.  Annual CT scan recommended. 2.  Aortic atherosclerosis, left main and left  anterior descending coronary artery disease. 3.  Mild diffuse bronchial wall thickening and mild centrilobular and subcutaneous emphysema, suggestive of underlying COPD  Coronary angiogram 11/19/2010: Normal coronaries.  Recent labs: 05/21/2020: Glucose 103, BUN/Cr 19/1.08. EGFR 74. Na/K 137/5.0. Rest of the CMP normal Chol 139, TG 118, HDL 50, LDL 68 BNP 15   Review of Systems  Cardiovascular:  Positive for chest pain and dyspnea on exertion (Mild, stable). Negative for leg swelling, palpitations and syncope.      Objective:    Vitals:   06/09/21 0939  BP: 119/87  Pulse: 74  Resp: 16  Temp: 97.8 F (36.6 C)  SpO2: 95%      Physical Exam Vitals and nursing note reviewed.  Constitutional:      General: He is not in acute distress. Neck:     Vascular: No JVD.  Cardiovascular:     Rate and Rhythm: Normal rate and regular rhythm.     Heart sounds: Normal heart sounds. No murmur heard. Pulmonary:     Effort: Pulmonary effort is normal.     Breath sounds: Normal breath sounds. No wheezing or rales.  Musculoskeletal:     Right lower leg: No edema.     Left lower leg: No edema.        Assessment & Recommendations:    60 y/o Caucasian male with former smoking history, coronary and aortic atherosclerosis, mildly reduced EF without heart failure symptoms, now with possible angina symptoms.  Angina: Known coronary calcification, including in left main. Known EF 45-50% without obvious heart failure symptoms. No ischemia on stress testing in 2019. Exercise nuclear stress has the potential to miss left main disease due to balanced ischemia. Therefore, I will obtain coronary CTA. While he does have coronary calcification, I am optimistic that we will still be able to obtain adequate opacification without significant artifact. If CTA is limited, we could consider invasive coronary angiogram to evaluate for left main disease.  Continue aspirin, statin.   Since he will get a  coronary CTA. I will check with pulmonology if the annual cancer screening CT scan can be skipped this year.   F/u after tests  Nigel Mormon, MD [facility] Pager: [phone] Office: [phone] If no answer Cell [phone]

## 2021-06-09 ENCOUNTER — Other Ambulatory Visit: Payer: Self-pay

## 2021-06-09 ENCOUNTER — Encounter: Payer: Self-pay | Admitting: Cardiology

## 2021-06-09 ENCOUNTER — Ambulatory Visit: Payer: 59 | Admitting: Cardiology

## 2021-06-09 VITALS — BP 119/87 | HR 74 | Temp 97.8°F | Resp 16 | Ht 72.0 in | Wt 197.0 lb

## 2021-06-09 DIAGNOSIS — I25118 Atherosclerotic heart disease of native coronary artery with other forms of angina pectoris: Secondary | ICD-10-CM

## 2021-06-09 DIAGNOSIS — I2510 Atherosclerotic heart disease of native coronary artery without angina pectoris: Secondary | ICD-10-CM

## 2021-06-09 DIAGNOSIS — I2089 Other forms of angina pectoris: Secondary | ICD-10-CM | POA: Insufficient documentation

## 2021-06-09 DIAGNOSIS — I208 Other forms of angina pectoris: Secondary | ICD-10-CM

## 2021-06-09 MED ORDER — ROSUVASTATIN CALCIUM 20 MG PO TABS: 20.0000 mg | ORAL_TABLET | Freq: Every day | ORAL | 3 refills | Status: DC

## 2021-06-09 MED ORDER — METOPROLOL TARTRATE 50 MG PO TABS: 50.0000 mg | ORAL_TABLET | Freq: Two times a day (BID) | ORAL | 1 refills | Status: DC

## 2021-06-09 NOTE — Patient Instructions (Signed)
Your cardiac CT will be scheduled at the locations below:   [facility]  [address]  [place], [place] [address]  [phone]    If scheduled at [facility], please arrive at the [facility] main entrance of [facility] 30-45 minutes prior to test start time.  Proceed to the [facility] Radiology Department (first floor) to check-in and test prep.   Please follow these instructions carefully (unless otherwise directed):    Hold all erectile dysfunction medications at least 3 days (72 hrs) prior to test.   On the Night Before the Test:   Be sure to Drink plenty of water.   Do not consume any caffeinated/decaffeinated beverages or chocolate 12 hours prior to your test.   Do not take any antihistamines 12 hours prior to your test.   If the patient has contrast allergy:  Patient will need a prescription for Prednisone and very clear instructions (as follows):  1. Prednisone 50 mg - take 13 hours prior to test  2. Take another Prednisone 50 mg 7 hours prior to test  3. Take another Prednisone 50 mg 1 hour prior to test  4. Take Benadryl 50 mg 1 hour prior to test   Patient must complete all four doses of above prophylactic medications.   Patient will need a ride after test due to Benadryl.   On the Day of the Test:   Drink plenty of water. Do not drink any water within one hour of the test.   Do not eat any food 4 hours prior to the test.   You may take your regular medications prior to the test.    - Metoprolol tartarate 50 mg twice a day starting two days before the CT scan. Please take a dose 2 hours before CT scan You may stop it after the CT scan, unless specified otherwise by me.           After the Test:   Drink plenty of water.   After receiving IV contrast, you may experience a mild flushed feeling. This is normal.   On occasion, you may experience a mild rash up to 24 hours after the test. This is not dangerous. If this  occurs, you can take Benadryl 25 mg and increase your fluid intake.   If you experience trouble breathing, this can be serious. If it is severe call 911 IMMEDIATELY. If it is mild, please call our office.   If you take any of these medications: Glipizide/Metformin, Avandament, Glucavance, please do not take 48 hours after completing test unless otherwise instructed.     Please contact the cardiac imaging nurse navigator should you have any questions/concerns  Rockwell Alexandria, RN Navigator Cardiac Imaging  [facility]  [phone] Office  [phone] Cell

## 2021-06-15 ENCOUNTER — Other Ambulatory Visit: Payer: Self-pay

## 2021-06-15 DIAGNOSIS — I25118 Atherosclerotic heart disease of native coronary artery with other forms of angina pectoris: Secondary | ICD-10-CM

## 2021-06-15 DIAGNOSIS — I208 Other forms of angina pectoris: Secondary | ICD-10-CM

## 2021-06-15 MED ORDER — METOPROLOL TARTRATE 50 MG PO TABS: 50.0000 mg | ORAL_TABLET | Freq: Two times a day (BID) | ORAL | 1 refills | Status: DC

## 2021-06-17 ENCOUNTER — Ambulatory Visit: Payer: 59

## 2021-06-17 ENCOUNTER — Other Ambulatory Visit: Payer: Self-pay

## 2021-06-17 ENCOUNTER — Other Ambulatory Visit: Payer: 59

## 2021-06-17 DIAGNOSIS — I25118 Atherosclerotic heart disease of native coronary artery with other forms of angina pectoris: Secondary | ICD-10-CM

## 2021-06-17 DIAGNOSIS — I208 Other forms of angina pectoris: Secondary | ICD-10-CM

## 2021-06-17 MED ORDER — METOPROLOL TARTRATE 50 MG PO TABS: 50.0000 mg | ORAL_TABLET | Freq: Two times a day (BID) | ORAL | 1 refills | Status: DC

## 2021-06-17 NOTE — Addendum Note (Signed)
Addended by: Elder Negus on: 06/17/2021 02:42 PM   Modules accepted: Orders

## 2021-06-22 LAB — BASIC METABOLIC PANEL
BUN/Creatinine Ratio: 19 (ref 10–24)
BUN: 20 mg/dL (ref 8–27)
CO2: 21 mmol/L (ref 20–29)
Calcium: 9.8 mg/dL (ref 8.6–10.2)
Chloride: 102 mmol/L (ref 96–106)
Creatinine, Ser: 1.05 mg/dL (ref 0.76–1.27)
Glucose: 96 mg/dL (ref 70–99)
Potassium: 4.5 mmol/L (ref 3.5–5.2)
Sodium: 138 mmol/L (ref 134–144)
eGFR: 81 mL/min/{1.73_m2} (ref >59)

## 2021-06-30 ENCOUNTER — Other Ambulatory Visit: Payer: Self-pay

## 2021-06-30 DIAGNOSIS — I25118 Atherosclerotic heart disease of native coronary artery with other forms of angina pectoris: Secondary | ICD-10-CM

## 2021-06-30 DIAGNOSIS — I208 Other forms of angina pectoris: Secondary | ICD-10-CM

## 2021-06-30 MED ORDER — METOPROLOL TARTRATE 50 MG PO TABS: 50.0000 mg | ORAL_TABLET | Freq: Two times a day (BID) | ORAL | 1 refills | Status: DC

## 2021-07-01 ENCOUNTER — Telehealth (HOSPITAL_COMMUNITY): Payer: Self-pay | Admitting: Emergency Medicine

## 2021-07-01 NOTE — Telephone Encounter (Signed)
Reaching out to patient to offer assistance regarding upcoming cardiac imaging study; pt verbalizes understanding of appt date/time, parking situation and where to check in, pre-test NPO status and medications ordered, and verified current allergies; name and call back number provided for further questions should they arise Rockwell Alexandria RN Navigator Cardiac Imaging [facility] [phone] office [phone] cell   Pt to take 50mg  metop  BID Sunday / Monday  Then 50mg  metop 2 hr prior to scan on Tuesday  Holding viagra, inhalers, lasix

## 2021-07-05 ENCOUNTER — Other Ambulatory Visit: Payer: Self-pay

## 2021-07-05 ENCOUNTER — Ambulatory Visit (HOSPITAL_COMMUNITY)
Admission: RE | Admit: 2021-07-05 | Discharge: 2021-07-05 | Disposition: A | Discharge status: Home / Self Care | Payer: 59 | Source: Ambulatory Visit | Attending: Cardiology | Admitting: Cardiology

## 2021-07-05 DIAGNOSIS — I25118 Atherosclerotic heart disease of native coronary artery with other forms of angina pectoris: Secondary | ICD-10-CM | POA: Insufficient documentation

## 2021-07-05 DIAGNOSIS — I700 Atherosclerosis of aorta: Secondary | ICD-10-CM | POA: Diagnosis not present

## 2021-07-05 DIAGNOSIS — I208 Other forms of angina pectoris: Secondary | ICD-10-CM

## 2021-07-05 DIAGNOSIS — J439 Emphysema, unspecified: Secondary | ICD-10-CM | POA: Diagnosis not present

## 2021-07-05 MED ORDER — IOHEXOL 350 MG/ML SOLN
100.0000 mL | Freq: Once | INTRAVENOUS | Status: AC | PRN
  Administered 2021-07-05: 95 mL via INTRAVENOUS

## 2021-07-05 MED ORDER — NITROGLYCERIN 0.4 MG SL SUBL
SUBLINGUAL_TABLET | SUBLINGUAL | Status: AC
  Administered 2021-07-05: 0.8 mg via SUBLINGUAL
  Filled 2021-07-05: qty 2

## 2021-07-05 MED ORDER — NITROGLYCERIN 0.4 MG SL SUBL: 0.8000 mg | SUBLINGUAL_TABLET | Freq: Once | SUBLINGUAL | Status: AC

## 2021-07-06 ENCOUNTER — Ambulatory Visit (HOSPITAL_COMMUNITY)
Admission: RE | Admit: 2021-07-06 | Discharge: 2021-07-06 | Disposition: A | Discharge status: Home / Self Care | Payer: 59 | Source: Ambulatory Visit | Attending: Cardiology | Admitting: Cardiology

## 2021-07-06 ENCOUNTER — Other Ambulatory Visit: Payer: Self-pay | Admitting: Cardiology

## 2021-07-06 DIAGNOSIS — R931 Abnormal findings on diagnostic imaging of heart and coronary circulation: Secondary | ICD-10-CM | POA: Diagnosis not present

## 2021-07-06 DIAGNOSIS — I2510 Atherosclerotic heart disease of native coronary artery without angina pectoris: Secondary | ICD-10-CM | POA: Insufficient documentation

## 2021-07-06 DIAGNOSIS — I208 Other forms of angina pectoris: Secondary | ICD-10-CM

## 2021-07-06 DIAGNOSIS — I25118 Atherosclerotic heart disease of native coronary artery with other forms of angina pectoris: Secondary | ICD-10-CM | POA: Insufficient documentation

## 2021-07-19 ENCOUNTER — Other Ambulatory Visit: Payer: Self-pay | Admitting: Cardiology

## 2021-07-19 DIAGNOSIS — R0609 Other forms of dyspnea: Secondary | ICD-10-CM

## 2021-07-21 ENCOUNTER — Other Ambulatory Visit: Payer: Self-pay

## 2021-07-21 ENCOUNTER — Ambulatory Visit: Payer: 59 | Admitting: Student

## 2021-07-21 ENCOUNTER — Encounter: Payer: Self-pay | Admitting: Student

## 2021-07-21 VITALS — BP 123/83 | HR 75 | Temp 97.8°F | Resp 16 | Ht 72.0 in | Wt 193.0 lb

## 2021-07-21 DIAGNOSIS — I2510 Atherosclerotic heart disease of native coronary artery without angina pectoris: Secondary | ICD-10-CM

## 2021-07-21 NOTE — Progress Notes (Signed)
Patient is here for follow up visit.  Subjective:   _0  ID: Isaac Casey, male    DOB: [birth year], 60 y.o.   MRN: [id number]   Chief Complaint  Patient presents with   Coronary Artery Disease   Follow-up    4-6 week     HPI  60 y.o. Caucasian male with former smoking history, COPD, coronary and aortic atherosclerosis, mildly reduced EF without heart failure symptoms.   Patient has historically had mildly reduced EF without any heart failure symptoms. Along with coronary calcification including in left main, as noted on cancer screening CT scan.  At last office visit patient had experienced 2 episodes of symptoms concerning for angina.  He therefore underwent coronary CTA which noted total coronary calcium score of 69.5 placing him in the 59th percentile.  CT FFR showed no significant stenosis.  Patient has had no recurrence of anginal symptoms and remains active.  Current Outpatient Medications on File Prior to Visit  Medication Sig Dispense Refill   albuterol (VENTOLIN HFA) 108 (90 Base) MCG/ACT inhaler Inhale 2 puffs into the lungs every 6 (six) hours as needed for wheezing or shortness of breath. 1 Inhaler 2   aspirin EC 81 MG tablet Take 81 mg by mouth daily.     Cholecalciferol (VITAMIN D PO) Take by mouth.     ciprofloxacin (CIPRO) 500 MG tablet SMARTSIG:1 Tablet(s) By Mouth Every 12 Hours     furosemide (LASIX) 20 MG tablet TAKE 1 TABLET BY MOUTH EVERY DAY AS NEEDED 90 tablet 1   ibuprofen (ADVIL,MOTRIN) 800 MG tablet Take 1 tablet (800 mg total) by mouth 3 (three) times daily. 21 tablet 0   losartan (COZAAR) 25 MG tablet TAKE 1 TABLET BY MOUTH EVERY DAY 90 tablet 1   metoprolol tartrate (LOPRESSOR) 50 MG tablet Take 1 tablet (50 mg total) by mouth 2 (two) times daily. 10 tablet 1   naproxen (NAPROSYN) 500 MG tablet Take 500 mg by mouth 2 (two) times daily.     omega-3 acid ethyl esters (LOVAZA) 1 G capsule Take by mouth 2 (two) times daily.     omeprazole  (PRILOSEC) 20 MG capsule Take 1 capsule by mouth daily.     rosuvastatin (CRESTOR) 20 MG tablet Take 1 tablet (20 mg total) by mouth daily. 90 tablet 3   sildenafil (VIAGRA) 100 MG tablet Take 1 tablet by mouth as needed.     tamsulosin (FLOMAX) 0.4 MG CAPS capsule Take 0.4 mg by mouth daily.     No current facility-administered medications on file prior to visit.    Cardiovascular studies:  Coronary CTA 07/06/2021: 1. Total coronary calcium score of 69.5. This was 59th percentile for age and sex matched control.   2. Normal coronary origin with right dominance.   3. CAD-RADS = 1.   Minimal stenosis (0-24%) due to mixed plaque at distal left main, otherwise the vessel is patent.   Minimal stenosis (0-24%) proximal LAD due to calcified plaque otherwise mid to distal vessel is patent.   LCx: Patent.   RCA: Patent.   4. Aortic atherosclerosis.   5. Study is sent for CT-FFR due to distal left main and proximal LAD disease findings will be performed and reported separately.   RECOMMENDATIONS:   Consider non-atherosclerotic causes of chest pain. Consider preventive therapy and risk factor modification.   1. Left Main: FFR = 0.99   2. LAD: Proximal FFR = 0.98, mid FFR = 0.94, distal FFR = 0.87 3.  LCX: Proximal FFR = 0.99, distal FFR = 0.96 4. RCA: Proximal FFR = 0.99, mid FFR =0.97, distal FFR = 0.95   IMPRESSION: 1.  CT FFR analysis showed no significant stenosis.      Left main: 2.14   Left anterior descending artery: 67.4   Left circumflex artery: 0   Right coronary artery: 0  EKG 06/09/2021: Sinus rhythm 73 bpm Normal EKG  CT chest 07/2020: 1. Lung-RADS 2, benign appearance or behavior. 2. Coronary artery calcifications noted. Aortic Atherosclerosis (ICD10-I70.0) and Emphysema (ICD10-J43.9).  Echocardiogram 05/26/2020:  Left ventricle cavity is normal in size. Mild concentric hypertrophy of  the left ventricle. Mild global hypokinesis. LVEF 40-45%. Normal  diastolic  filling pattern.  Mild (Grade I) mitral regurgitation.  Mild tricuspid regurgitation.  No evidence of pulmonary hypertension.  On direct comparison of images from study in 2019, there is no significant  change in LVEF.   Exercise myoview stress 08/23/2018: 1. The resting electrocardiogram demonstrated normal sinus rhythm, incomplete RBBB, no resting arrhythmias and normal rest repolarization.  Stress EKG is negative for myocardial ischemia. With peak exercise there was 1 pair of Ventricular couplet and a single PVC.  There is no ST-T wave changes of ischemia. Patient exercised on Bruce protocol for 6:26 minutes and achieved  7.7 METS. Stress test terminated due to  Dyspnea and 87% MPHR achieved (Target HR >85%). Resting blood pressure 142/90 and peak blood pressure 200/84 mmHg. 2. Stress and rest SPECT images demonstrate homogeneous tracer distribution throughout the myocardium. Gated SPECT imaging reveals normal myocardial thickening and wall motion. The left ventricular ejection fraction was calculated at 39%, however visually appears normal.    3. This is an intermediate risk study, clinical correlation and echo correlation recommended.  CT chest lungs screening 06/19/2018: 1. Benign lung appearance or behavior.  Annual CT scan recommended. 2.  Aortic atherosclerosis, left main and left anterior descending coronary artery disease. 3.  Mild diffuse bronchial wall thickening and mild centrilobular and subcutaneous emphysema, suggestive of underlying COPD  Coronary angiogram 11/19/2010: Normal coronaries.  Recent labs: CMP Latest Ref Rng & Units 06/21/2021 05/21/2020 10/25/2010  Glucose 70 - 99 mg/dL 96 103(H) 124(H)  BUN 8 - 27 mg/dL _0 Creatinine 0.76 - 1.27 mg/dL 1.05 1.08 1.2  Sodium 134 - 144 mmol/L 138 137 143  Potassium 3.5 - 5.2 mmol/L 4.5 5.0 3.9  Chloride 96 - 106 mmol/L 102 103 106  CO2 20 - 29 mmol/L _1 Calcium 8.6 - 10.2 mg/dL 9.8 9.7 10.4   CBC  Latest Ref Rng & Units 10/25/2010  WBC 4.0 - 10.5 K/uL 8.6  Hemoglobin 13.0 - 17.0 g/dL 16.1  Hematocrit 39.0 - 52.0 % 46.0  Platelets 150 - 400 K/uL 276   Lipid Panel     Component Value Date/Time   CHOL 139 05/21/2020 0847   TRIG 118 05/21/2020 0847   HDL 50 05/21/2020 0847   CHOLHDL 2.8 05/21/2020 0847   LDLCALC 68 05/21/2020 0847   HEMOGLOBIN A1C No results found for: HGBA1C, MPG TSH No results for input(s): TSH in the last 8760 hours.  05/21/2020: Glucose 103, BUN/Cr 19/1.08. EGFR 74. Na/K 137/5.0. Rest of the CMP normal Chol 139, TG 118, HDL 50, LDL 68 BNP 15   Review of Systems  Cardiovascular:  Positive for dyspnea on exertion (Mild, stable). Negative for chest pain (no recurrence), claudication, leg swelling, near-syncope, orthopnea, palpitations, paroxysmal nocturnal dyspnea and syncope.  Respiratory:  Negative for shortness of  breath.   Neurological:  Negative for dizziness.      Objective:    Vitals:   07/21/21 1035 07/21/21 1038  BP: (!) 144/90 123/83  Pulse: 78 75  Resp: 16   Temp: 97.8 F (36.6 C)   SpO2: 98%       Physical Exam Vitals and nursing note reviewed.  Constitutional:      General: He is not in acute distress. Neck:     Vascular: No JVD.  Cardiovascular:     Rate and Rhythm: Normal rate and regular rhythm.     Heart sounds: Normal heart sounds. No murmur heard. Pulmonary:     Effort: Pulmonary effort is normal.     Breath sounds: Normal breath sounds. No wheezing or rales.  Musculoskeletal:     Right lower leg: No edema.     Left lower leg: No edema.  Physical exam unchanged compared to previous office visit.      Assessment & Recommendations:    60 y.o. Caucasian male with former smoking history, COPD, coronary and aortic atherosclerosis, mildly reduced EF without heart failure symptoms.   Angina: Known coronary calcification, including in left main. Known EF 45-50% without obvious heart failure symptoms. No ischemia on  stress testing in 2019. CTA and CT FFR noted no significant coronary stenosis, with total coronary calcium score of 69.5 placing him in the 59th percentile. Patient has had no recurrence of anginal symptoms. Recommend risk factor management. Continue aspirin, statin, losartan, Lopressor.  She is otherwise stable from a cardiovascular standpoint.  Follow-up in 6 months, sooner if needed.   Alethia Berthold, PA-C 07/21/2021, 3:25 PM Office: [phone]

## 2021-09-02 ENCOUNTER — Other Ambulatory Visit: Payer: Self-pay | Admitting: *Deleted

## 2021-09-02 DIAGNOSIS — Z87891 Personal history of nicotine dependence: Secondary | ICD-10-CM

## 2021-12-12 ENCOUNTER — Other Ambulatory Visit: Payer: Self-pay | Admitting: Cardiology

## 2021-12-12 DIAGNOSIS — R0609 Other forms of dyspnea: Secondary | ICD-10-CM

## 2022-01-02 ENCOUNTER — Other Ambulatory Visit: Payer: Self-pay

## 2022-01-02 DIAGNOSIS — R0609 Other forms of dyspnea: Secondary | ICD-10-CM

## 2022-01-02 MED ORDER — LOSARTAN POTASSIUM 25 MG PO TABS
25.0000 mg | ORAL_TABLET | Freq: Every day | ORAL | 1 refills | Status: DC
Start: 2022-01-02 — End: 2022-07-03

## 2022-01-17 NOTE — Progress Notes (Signed)
? ? ?Patient is here for follow up visit. ? ?Subjective:  ? ?_0  ID: Isaac Casey, male    DOB: 03-20-1961, 61 y.o.   MRN: [id number] ? ? ?Chief Complaint  ?Patient presents with  ? Coronary Artery Disease  ?  6 MONTH  ? reduced EF  ? ? ? ?HPI ? ?61 y.o. Caucasian male with former smoking history, COPD, coronary and aortic atherosclerosis, mildly reduced EF without heart failure symptoms.  ? ?Patient has historically had mildly reduced EF without any heart failure symptoms. Along with coronary calcification including in left main, as noted on cancer screening CT scan.   ? ?Patient presents for 85-monthfollow-up.  At last office visit CTA and CT FFR noted no significant coronary stenosis, therefore no changes were made as patient was stable from a cardiovascular standpoint. I personally reviewed external labs, lipids are well controlled. Patient's primary concern today is intermittent wheezing particularly with worsening allergies over the last several weeks. He is also concerned that muscle aches of the shoulder girdle are related to statin therapy.  ? ?Patient has had no recurrence of anginal symptoms and remains active. ? ?Current Outpatient Medications on File Prior to Visit  ?Medication Sig Dispense Refill  ? albuterol (VENTOLIN HFA) 108 (90 Base) MCG/ACT inhaler Inhale 2 puffs into the lungs every 6 (six) hours as needed for wheezing or shortness of breath. 1 Inhaler 2  ? aspirin EC 81 MG tablet Take 81 mg by mouth daily.    ? Cholecalciferol (VITAMIN D PO) Take by mouth.    ? furosemide (LASIX) 20 MG tablet TAKE 1 TABLET BY MOUTH EVERY DAY AS NEEDED 90 tablet 1  ? ibuprofen (ADVIL,MOTRIN) 800 MG tablet Take 1 tablet (800 mg total) by mouth 3 (three) times daily. 21 tablet 0  ? losartan (COZAAR) 25 MG tablet Take 1 tablet (25 mg total) by mouth daily. 90 tablet 1  ? metoprolol tartrate (LOPRESSOR) 50 MG tablet Take 1 tablet (50 mg total) by mouth 2 (two) times daily. 10 tablet 1  ? omega-3 acid ethyl  esters (LOVAZA) 1 G capsule Take by mouth 2 (two) times daily.    ? omeprazole (PRILOSEC) 20 MG capsule Take 1 capsule by mouth daily.    ? rosuvastatin (CRESTOR) 20 MG tablet Take 1 tablet (20 mg total) by mouth daily. 90 tablet 3  ? sildenafil (VIAGRA) 100 MG tablet Take 1 tablet by mouth as needed.    ? tamsulosin (FLOMAX) 0.4 MG CAPS capsule Take 0.4 mg by mouth daily.    ? ciprofloxacin (CIPRO) 500 MG tablet SMARTSIG:1 Tablet(s) By Mouth Every 12 Hours    ? naproxen (NAPROSYN) 500 MG tablet Take 500 mg by mouth 2 (two) times daily.    ? ?No current facility-administered medications on file prior to visit.  ? ? ?Cardiovascular studies: ?EKG 01/18/2022:  ?Sinus rhythm at a rate of 73 bpm.  Normal axis.  No evidence of ischemia or underlying injury pattern.  Compared to EKG 06/09/2021, no significant change. ? ?Coronary CTA 07/06/2021: ?1. Total coronary calcium score of 69.5. This was 59th percentile ?for age and sex matched control. ?  ?2. Normal coronary origin with right dominance. ?  ?3. CAD-RADS = 1. ?  ?Minimal stenosis (0-24%) due to mixed plaque at distal left main, ?otherwise the vessel is patent. ?  ?Minimal stenosis (0-24%) proximal LAD due to calcified plaque ?otherwise mid to distal vessel is patent. ?  ?LCx: Patent. ?  ?RCA: Patent. ?  ?4.  Aortic atherosclerosis. ?  ?5. Study is sent for CT-FFR due to distal left main and proximal LAD ?disease findings will be performed and reported separately. ?  ?RECOMMENDATIONS: ?  ?Consider non-atherosclerotic causes of chest pain. Consider ?preventive therapy and risk factor modification. ?  ?1. Left Main: FFR = 0.99 ?  ?2. LAD: Proximal FFR = 0.98, mid FFR = 0.94, distal FFR = 0.87 ?3. LCX: Proximal FFR = 0.99, distal FFR = 0.96 ?4. RCA: Proximal FFR = 0.99, mid FFR =0.97, distal FFR = 0.95 ?  ?IMPRESSION: ?1.  CT FFR analysis showed no significant stenosis. ?  ? ?  ?Left main: 2.14 ?  ?Left anterior descending artery: 67.4 ?  ?Left circumflex artery: 0 ?  ?Right  coronary artery: 0 ? ?CT chest 07/2020: ?1. Lung-RADS 2, benign appearance or behavior. ?2. Coronary artery calcifications noted. ?Aortic Atherosclerosis (ICD10-I70.0) and Emphysema (ICD10-J43.9). ? ?Echocardiogram 06/17/2021:  ?Left ventricle cavity is normal in size and wall thickness. Normal global  ?wall motion. Normal LV systolic function with EF 57%. Normal diastolic  ?filling pattern.  ?Mild (Grade I) mitral regurgitation.  ?Mild tricuspid regurgitation.  ?No evidence of pulmonary hypertension.  ?Previous study on 05/26/2020 noted LVEF 40-45% with mild hypokinesis, now  ?absent. ? ?Exercise myoview stress 08/23/2018: ?1. The resting electrocardiogram demonstrated normal sinus rhythm, incomplete RBBB, no resting arrhythmias and normal rest repolarization.  Stress EKG is negative for myocardial ischemia. With peak exercise there was 1 pair of Ventricular couplet and a single PVC.  There is no ST-T wave changes of ischemia. Patient exercised on Bruce protocol for 6:26 minutes and achieved  7.7 METS. Stress test terminated due to  Dyspnea and 87% MPHR achieved (Target HR >85%). Resting blood pressure 142/90 and peak blood pressure 200/84 mmHg. ?2. Stress and rest SPECT images demonstrate homogeneous tracer distribution throughout the myocardium. Gated SPECT imaging reveals normal myocardial thickening and wall motion. The left ventricular ejection fraction was calculated at 39%, however visually appears normal.    ?3. This is an intermediate risk study, clinical correlation and echo correlation recommended. ? ?CT chest lungs screening 06/19/2018: ?1. Benign lung appearance or behavior.  Annual CT scan recommended. ?2.  Aortic atherosclerosis, left main and left anterior descending coronary artery disease. ?3.  Mild diffuse bronchial wall thickening and mild centrilobular and subcutaneous emphysema, suggestive of underlying COPD ? ?Coronary angiogram 11/19/2010: ?Normal coronaries. ? ?Recent labs: ? ?  Latest Ref  Rng & Units 06/21/2021  ? 11:09 AM 05/21/2020  ?  8:47 AM 10/25/2010  ?  1:02 AM  ?CMP  ?Glucose 70 - 99 mg/dL 96   103   124    ?BUN 8 - 27 mg/dL _0 ?Creatinine 0.76 - 1.27 mg/dL 1.05   1.08   1.2    ?Sodium 134 - 144 mmol/L 138   137   143    ?Potassium 3.5 - 5.2 mmol/L 4.5   5.0   3.9    ?Chloride 96 - 106 mmol/L 102   103   106    ?CO2 20 - 29 mmol/L _1 ?Calcium 8.6 - 10.2 mg/dL 9.8   9.7   10.4    ? ? ?  Latest Ref Rng & Units 10/25/2010  ?  1:02 AM  ?CBC  ?WBC 4.0 - 10.5 K/uL 8.6    ?Hemoglobin 13.0 - 17.0 g/dL 16.1    ?Hematocrit 39.0 -  52.0 % 46.0    ?Platelets 150 - 400 K/uL 276    ? ?Lipid Panel  ?   ?Component Value Date/Time  ? CHOL 139 05/21/2020 0847  ? TRIG 118 05/21/2020 0847  ? HDL 50 05/21/2020 0847  ? CHOLHDL 2.8 05/21/2020 0847  ? Fowler 68 05/21/2020 0847  ? ?HEMOGLOBIN A1C ?No results found for: HGBA1C, MPG ?TSH ?No results for input(s): TSH in the last 8760 hours. ? ?07/13/2021: ?A1c 5.6% ?BUN 17, creatinine 1.04, GFR >60, potassium 4.4, sodium 137 ?LDL 56, HDL 49, total cholesterol 117, triglycerides 51 ? ?05/21/2020: ?Glucose 103, BUN/Cr 19/1.08. EGFR 74. Na/K 137/5.0. Rest of the CMP normal ?Chol 139, TG 118, HDL 50, LDL 68 ?BNP 15 ? ? ?Review of Systems  ?Cardiovascular:  Positive for dyspnea on exertion (Mild, stable). Negative for chest pain (no recurrence), claudication, leg swelling, near-syncope, orthopnea, palpitations, paroxysmal nocturnal dyspnea and syncope.  ?Respiratory:  Positive for wheezing (improves with albuterol inhaler). Negative for shortness of breath.   ?Neurological:  Negative for dizziness.  ? ?   ?Objective:  ? ? ?Vitals:  ? 01/18/22 0904  ?BP: 117/84  ?Pulse: 78  ?Resp: 17  ?Temp: 98.2 ?F (36.8 ?C)  ?SpO2: 94%  ? ? ? ? Physical Exam ?Vitals and nursing note reviewed.  ?Constitutional:   ?   General: He is not in acute distress. ?Neck:  ?   Vascular: No JVD.  ?Cardiovascular:  ?   Rate and Rhythm: Normal rate and regular rhythm.  ?   Heart  sounds: Normal heart sounds. No murmur heard. ?Pulmonary:  ?   Effort: Pulmonary effort is normal.  ?   Breath sounds: Wheezing (bilateral upper lung fields) present. No rales.  ?Musculoskeletal:  ?   Right

## 2022-01-18 ENCOUNTER — Ambulatory Visit: Payer: 59 | Admitting: Student

## 2022-01-18 ENCOUNTER — Encounter: Payer: Self-pay | Admitting: Student

## 2022-01-18 VITALS — BP 117/84 | HR 78 | Temp 98.2°F | Resp 17 | Ht 72.0 in | Wt 199.2 lb

## 2022-01-18 DIAGNOSIS — I2510 Atherosclerotic heart disease of native coronary artery without angina pectoris: Secondary | ICD-10-CM

## 2022-01-18 DIAGNOSIS — I208 Other forms of angina pectoris: Secondary | ICD-10-CM

## 2022-04-04 ENCOUNTER — Ambulatory Visit (INDEPENDENT_AMBULATORY_CARE_PROVIDER_SITE_OTHER): Payer: 59 | Admitting: Pulmonary Disease

## 2022-04-04 ENCOUNTER — Encounter: Payer: Self-pay | Admitting: Pulmonary Disease

## 2022-04-04 DIAGNOSIS — J432 Centrilobular emphysema: Secondary | ICD-10-CM | POA: Diagnosis not present

## 2022-04-04 DIAGNOSIS — R911 Solitary pulmonary nodule: Secondary | ICD-10-CM | POA: Diagnosis not present

## 2022-04-04 MED ORDER — ALBUTEROL SULFATE HFA 108 (90 BASE) MCG/ACT IN AERS: 2.0000 | INHALATION_SPRAY | Freq: Four times a day (QID) | RESPIRATORY_TRACT | 2 refills | Status: DC | PRN

## 2022-04-04 MED ORDER — ANORO ELLIPTA 62.5-25 MCG/ACT IN AEPB: 1.0000 | INHALATION_SPRAY | Freq: Every day | RESPIRATORY_TRACT | 4 refills | Status: DC

## 2022-04-04 NOTE — Assessment & Plan Note (Signed)
We will refer him back to the lung cancer screening program to get him back on schedule with annual CT

## 2022-04-04 NOTE — Assessment & Plan Note (Addendum)
We will start him on maintenance LABA/LAMA combination with either Anoro or Stiolto depending on which one is covered by his insurance.  If both of these are expensive then we may have to rely on generic such as Wixela  I discussed signs and symptoms of COPD exacerbation and COPD action plan  He keeps his grandkids and encouraged him to get the RSV vaccine and the flu shot in the fall

## 2022-04-04 NOTE — Addendum Note (Signed)
Addended by: Bradd Canary L on: 04/04/2022 04:18 PM   Modules accepted: Orders

## 2022-04-04 NOTE — Progress Notes (Signed)
   Subjective:    Patient ID: Isaac Casey, male    DOB: 1960-09-10, 61 y.o.   MRN: [id number]  HPI  61 yo for FU of emphysema &  Dyspnea He smoked more than 40 pack years before he quit in 2016  Last seen 01/2020 He was started on Anoro last visit which helped but he was unable to continue due to cost issues.  He had an episode of bronchitis about a month ago which really put him down and took him a few weeks to recover.  He is now agreeable to using maintenance therapy He also has several questions about immunization  We reviewed his low-dose CT results and he is due for screening CT again  Significant tests/ events reviewed  PFTs 01/2020, moderate airway obstruction, ratio 57, FEV1 64%, FVC 84%, DLCO 68% LDCT 07/2020 rads 2 LDCT 06/2019  RADS 2 , Pulmonary nodules measure 4.1 mm or less in size   LDCT 06/2018 - RADS-2S with right upper lobe 5 mm nodule and mild centrilobular emphysema.      Review of Systems neg for any significant sore throat, dysphagia, itching, sneezing, nasal congestion or excess/ purulent secretions, fever, chills, sweats, unintended wt loss, pleuritic or exertional cp, hempoptysis, orthopnea pnd or change in chronic leg swelling. Also denies presyncope, palpitations, heartburn, abdominal pain, nausea, vomiting, diarrhea or change in bowel or urinary habits, dysuria,hematuria, rash, arthralgias, visual complaints, headache, numbness weakness or ataxia.     Objective:   Physical Exam  Gen. Pleasant, well-nourished, in no distress ENT - no thrush, no pallor/icterus,no post nasal drip Neck: No JVD, no thyromegaly, no carotid bruits Lungs: no use of accessory muscles, no dullness to percussion, clear without rales or rhonchi  Cardiovascular: Rhythm regular, heart sounds  normal, no murmurs or gallops, no peripheral edema Musculoskeletal: No deformities, no cyanosis or clubbing        Assessment & Plan:

## 2022-04-04 NOTE — Patient Instructions (Signed)
X Rx for  Anoro 1 puff daily If this is expensive, alternative would be Stiolto  X referral to lung cancer screening program  We discussed RSV vaccine

## 2022-04-13 ENCOUNTER — Other Ambulatory Visit (HOSPITAL_COMMUNITY): Payer: Self-pay

## 2022-04-13 ENCOUNTER — Telehealth: Payer: Self-pay | Admitting: Pulmonary Disease

## 2022-04-13 NOTE — Telephone Encounter (Signed)
Pharmacy team, can we check to see if Isaac Casey is covered by his insurance? Thanks!

## 2022-04-13 NOTE — Telephone Encounter (Signed)
I called the patient and he reports that he has not taken Anoro in 2 days and he reports it was making him feel dizzy during the day and lightheaded if he had to bend over. He is not having any other symptoms at this time.   He reports its costing him a lot of money close to $200. He wants to know if you want him to switch to another inhaler. Please advise.

## 2022-04-14 NOTE — Telephone Encounter (Signed)
Left message for patient to call back  

## 2022-04-19 NOTE — Telephone Encounter (Signed)
Called and spoke with pt letting him know the info per prior auth team. 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 Pt said that after having the dizziness, he did stop taking the anoro as he saw that it was listed as a potential side effect and after stopping it, symptoms did subside. Nothing further needed.

## 2022-06-17 ENCOUNTER — Other Ambulatory Visit: Payer: Self-pay | Admitting: Cardiology

## 2022-06-17 DIAGNOSIS — I25118 Atherosclerotic heart disease of native coronary artery with other forms of angina pectoris: Secondary | ICD-10-CM

## 2022-07-01 ENCOUNTER — Other Ambulatory Visit: Payer: Self-pay | Admitting: Cardiology

## 2022-07-01 DIAGNOSIS — R0609 Other forms of dyspnea: Secondary | ICD-10-CM

## 2022-07-06 ENCOUNTER — Ambulatory Visit
Admission: RE | Admit: 2022-07-06 | Discharge: 2022-07-06 | Disposition: A | Discharge status: Home / Self Care | Payer: 59 | Source: Ambulatory Visit | Attending: Family Medicine | Admitting: Family Medicine

## 2022-07-06 DIAGNOSIS — Z87891 Personal history of nicotine dependence: Secondary | ICD-10-CM

## 2022-07-10 ENCOUNTER — Other Ambulatory Visit: Payer: Self-pay

## 2022-07-10 DIAGNOSIS — Z122 Encounter for screening for malignant neoplasm of respiratory organs: Secondary | ICD-10-CM

## 2022-07-10 DIAGNOSIS — Z87891 Personal history of nicotine dependence: Secondary | ICD-10-CM

## 2022-07-24 ENCOUNTER — Ambulatory Visit: Payer: Commercial Managed Care - HMO | Admitting: Cardiology

## 2022-07-24 ENCOUNTER — Encounter: Payer: Self-pay | Admitting: Cardiology

## 2022-07-24 ENCOUNTER — Ambulatory Visit: Payer: Commercial Managed Care - HMO | Admitting: Student

## 2022-07-24 VITALS — BP 107/86 | HR 94 | Resp 16 | Ht 72.0 in | Wt 189.0 lb

## 2022-07-24 DIAGNOSIS — I2510 Atherosclerotic heart disease of native coronary artery without angina pectoris: Secondary | ICD-10-CM

## 2022-07-24 DIAGNOSIS — R931 Abnormal findings on diagnostic imaging of heart and coronary circulation: Secondary | ICD-10-CM | POA: Insufficient documentation

## 2022-07-24 NOTE — Progress Notes (Signed)
Patient is here for follow up visit.  Subjective:   _0  ID: Isaac Casey, male    DOB: 04-Dec-1960, 61 y.o.   MRN: [id number]   Chief Complaint  Patient presents with   Coronary Artery Disease   Hyperlipidemia   Follow-up    6 month     HPI  61 y.o. Caucasian male with former smoking history, COPD, coronary and aortic atherosclerosis, mildly reduced EF without heart failure symptoms.   Other than lightheadedness episodes during summer, that occurred while standing up from bending down position, he has no other symptoms. He denies chest pain, shortness of breath, palpitations, leg edema, orthopnea, PND, TIA/syncope.    Current Outpatient Medications:    albuterol (VENTOLIN HFA) 108 (90 Base) MCG/ACT inhaler, Inhale 2 puffs into the lungs every 6 (six) hours as needed for wheezing or shortness of breath., Disp: 1 each, Rfl: 2   aspirin EC 81 MG tablet, Take 81 mg by mouth daily., Disp: , Rfl:    Cholecalciferol (VITAMIN D PO), Take by mouth., Disp: , Rfl:    furosemide (LASIX) 20 MG tablet, TAKE 1 TABLET BY MOUTH EVERY DAY AS NEEDED, Disp: 90 tablet, Rfl: 1   ibuprofen (ADVIL,MOTRIN) 800 MG tablet, Take 1 tablet (800 mg total) by mouth 3 (three) times daily., Disp: 21 tablet, Rfl: 0   losartan (COZAAR) 25 MG tablet, TAKE 1 TABLET (25 MG TOTAL) BY MOUTH DAILY., Disp: 90 tablet, Rfl: 1   omega-3 acid ethyl esters (LOVAZA) 1 G capsule, Take by mouth 2 (two) times daily., Disp: , Rfl:    omeprazole (PRILOSEC) 20 MG capsule, Take 1 capsule by mouth daily., Disp: , Rfl:    rosuvastatin (CRESTOR) 20 MG tablet, TAKE 1 TABLET BY MOUTH EVERY DAY, Disp: 90 tablet, Rfl: 3   sildenafil (VIAGRA) 100 MG tablet, Take 1 tablet by mouth as needed., Disp: , Rfl:    tamsulosin (FLOMAX) 0.4 MG CAPS capsule, Take 0.4 mg by mouth daily., Disp: , Rfl:    metoprolol tartrate (LOPRESSOR) 50 MG tablet, Take 1 tablet (50 mg total) by mouth 2 (two) times daily., Disp: 10 tablet, Rfl: 1    umeclidinium-vilanterol (ANORO ELLIPTA) 62.5-25 MCG/ACT AEPB, Inhale 1 puff into the lungs daily. (Patient not taking: Reported on 07/24/2022), Disp: 14 each, Rfl: 4  Cardiovascular studies:   EKG 07/24/2022: Sinus rhythm 80 bpm Normal EKG  Coronary CTA 07/06/2021: 1. Total coronary calcium score of 69.5. This was 59th percentile for age and sex matched control. 2. Normal coronary origin with right dominance. 3. CAD-RADS = 1. Minimal stenosis (0-24%) due to mixed plaque at distal left main, otherwise the vessel is patent. Minimal stenosis (0-24%) proximal LAD due to calcified plaque otherwise mid to distal vessel is patent. LCx: Patent. RCA: Patent. 4. Aortic atherosclerosis. 5. Study is sent for CT-FFR due to distal left main and proximal LAD disease findings will be performed and reported separately.     1. Left Main: FFR = 0.99 2. LAD: Proximal FFR = 0.98, mid FFR = 0.94, distal FFR = 0.87 3. LCX: Proximal FFR = 0.99, distal FFR = 0.96 4. RCA: Proximal FFR = 0.99, mid FFR =0.97, distal FFR = 0.95   IMPRESSION: 1.  CT FFR analysis showed no significant stenosis.  Calcium score:  Left main: 2.14 Left anterior descending artery: 67.4 Left circumflex artery: 0 Right coronary artery: 0  CT chest 07/2020: 1. Lung-RADS 2, benign appearance or behavior. 2. Coronary artery calcifications noted. Aortic Atherosclerosis (ICD10-I70.0)  and Emphysema (ICD10-J43.9).  Echocardiogram 05/26/2020:  Left ventricle cavity is normal in size. Mild concentric hypertrophy of  the left ventricle. Mild global hypokinesis. LVEF 40-45%. Normal diastolic  filling pattern.  Mild (Grade I) mitral regurgitation.  Mild tricuspid regurgitation.  No evidence of pulmonary hypertension.  On direct comparison of images from study in 2019, there is no significant  change in LVEF.   Exercise myoview stress 08/23/2018: 1. The resting electrocardiogram demonstrated normal sinus rhythm, incomplete RBBB,  no resting arrhythmias and normal rest repolarization.  Stress EKG is negative for myocardial ischemia. With peak exercise there was 1 pair of Ventricular couplet and a single PVC.  There is no ST-T wave changes of ischemia. Patient exercised on Bruce protocol for 6:26 minutes and achieved  7.7 METS. Stress test terminated due to  Dyspnea and 87% MPHR achieved (Target HR >85%). Resting blood pressure 142/90 and peak blood pressure 200/84 mmHg. 2. Stress and rest SPECT images demonstrate homogeneous tracer distribution throughout the myocardium. Gated SPECT imaging reveals normal myocardial thickening and wall motion. The left ventricular ejection fraction was calculated at 39%, however visually appears normal.    3. This is an intermediate risk study, clinical correlation and echo correlation recommended.  CT chest lungs screening 06/19/2018: 1. Benign lung appearance or behavior.  Annual CT scan recommended. 2.  Aortic atherosclerosis, left main and left anterior descending coronary artery disease. 3.  Mild diffuse bronchial wall thickening and mild centrilobular and subcutaneous emphysema, suggestive of underlying COPD  Coronary angiogram 11/19/2010: Normal coronaries.  Recent labs: 07/13/2021: Glucose 114, BUN/Cr 17/1.0. EGFR 81. K 4.4. HbA1C 5.6% Chol 117, TG 51, HDL 49, LDL 56   Review of Systems  Cardiovascular:  Positive for dyspnea on exertion (Mild, stable). Negative for chest pain (no recurrence), claudication, leg swelling, near-syncope, orthopnea, palpitations, paroxysmal nocturnal dyspnea and syncope.  Respiratory:  Negative for shortness of breath.   Neurological:  Negative for dizziness.       Objective:    Vitals:   07/24/22 0917  BP: 107/86  Pulse: 94  Resp: 16  SpO2: 94%      Physical Exam Vitals and nursing note reviewed.  Constitutional:      General: He is not in acute distress. Neck:     Vascular: No JVD.  Cardiovascular:     Rate and Rhythm: Normal  rate and regular rhythm.     Heart sounds: Normal heart sounds. No murmur heard. Pulmonary:     Effort: Pulmonary effort is normal.     Breath sounds: Normal breath sounds. No wheezing or rales.  Musculoskeletal:     Right lower leg: No edema.     Left lower leg: No edema.   Physical exam unchanged compared to previous office visit.      Assessment & Recommendations:    61 y.o. Caucasian male with former smoking history, COPD, coronary and aortic atherosclerosis, mildly reduced EF without heart failure symptoms.   Coronary calcium: Known coronary calcification, including minimal calcium in left main. EF 40-45% (2021) % without obvious heart failure symptoms. No ischemia on stress testing (2019). CTA and CT FFR with no significant CAD, calcium score 69 (59th percentile) (07/2021). Okay to skip Aspirin. He is not on metoprolol. Continue aspirin, statin, losartan. Check echocardiogram now. If remains low, will consider adding low dose metoprolol succinate.  Encourage hydration, especially during summer months.  F/u in 1 year    [employer], PA-C 07/24/2022, 9:05 AM Office: [phone]

## 2022-08-02 ENCOUNTER — Ambulatory Visit: Payer: Commercial Managed Care - HMO

## 2022-08-02 ENCOUNTER — Telehealth: Payer: Self-pay

## 2022-08-02 DIAGNOSIS — R931 Abnormal findings on diagnostic imaging of heart and coronary circulation: Secondary | ICD-10-CM

## 2022-08-02 DIAGNOSIS — I2510 Atherosclerotic heart disease of native coronary artery without angina pectoris: Secondary | ICD-10-CM

## 2022-08-02 NOTE — Telephone Encounter (Signed)
Patient called to inform us that he went to see his PCP on Monday 11/27 and was informed that his bp was low 104/72, patient mention that his PCP told him to give [place] a call to see what medication he should be off. Patient stated that he had another episodes of passing out when he went golfing.   Per [place] to take him off of Tamsulosin, but keep him on Losartan. Patient did not want to stop his Tamsulosin, patient mention that his PCP told him to try taking the Tamsulosin at bedtime and the Losartan in the morning. Patient stated that he has been doing that for the pasted 2 days.  Patient will go golfing tomorrow and will let us know how he is feeling.

## 2022-08-09 NOTE — Progress Notes (Signed)
Called and spoke with patient regarding his recent echocardiogram results

## 2022-08-09 NOTE — Progress Notes (Signed)
Left message for patient to call back  

## 2022-10-05 ENCOUNTER — Ambulatory Visit (INDEPENDENT_AMBULATORY_CARE_PROVIDER_SITE_OTHER): Payer: 59 | Admitting: Nurse Practitioner

## 2022-10-05 ENCOUNTER — Telehealth: Payer: Self-pay | Admitting: Nurse Practitioner

## 2022-10-05 ENCOUNTER — Encounter: Payer: Self-pay | Admitting: Nurse Practitioner

## 2022-10-05 VITALS — BP 102/72 | HR 87 | Ht 72.5 in | Wt 200.6 lb

## 2022-10-05 DIAGNOSIS — J432 Centrilobular emphysema: Secondary | ICD-10-CM

## 2022-10-05 DIAGNOSIS — R911 Solitary pulmonary nodule: Secondary | ICD-10-CM | POA: Diagnosis not present

## 2022-10-05 NOTE — Progress Notes (Signed)
@Patient  ID: Isaac Casey, male    DOB: [birth year], 62 y.o.   MRN: [id number]  Chief Complaint  Patient presents with   Follow-up    Pt f/u he states that he is feeling well and his breathing is fine, he is curious to see if his new insurance will cover the anoro inhaler more.    Referring provider: Lujean Amel, MD  HPI: 62 year old male, former smoker followed for COPD with emphysema and lung nodules. He is a patient of Dr. Bari Mantis and last seen in office 04/04/2022. Past medical history significant for CAD, HTN.  TEST/EVENTS:  01/27/2020 PFT: FVC 84, FEV1 64, ratio 55, DLCO 68.  No BD. 07/06/2022 LDCT chest: Atherosclerosis.  No LAD.  Tiny hiatal hernia.  Moderate centrilobular emphysema.  Pleural-based right-sided pulmonary nodules, largest 3.3 mm from 6.5 mm on previous exam.  Lung RADS 2. 08/02/2022 echo: EF 55 to 60%.  Normal diastolic filling pattern.  No evidence of PH.  No significant valvular abnormalities.  04/04/2022: OV with Dr. Elsworth Soho.  Previous pulmonary function testing with moderate airway obstruction.  He was started on Anoro at a previous visit but was unable to continue due to cost issues.  He did have an episode of bronchitis about a month ago which really put him down and took him a few weeks to recover.  Agreeable to restarting maintenance therapy.  Rx resent for Anoro.  If expensive, alternative would be Stiolto.  Referred back to the lung cancer screening program as he is overdue for LDCT chest.  10/05/2022: Today - follow up Patient presents today for follow-up.  He was last seen in August with Dr. Elsworth Soho.  He was going to start on LAMA/LABA therapy; however, he had some trouble with insurance coverage and cost so he never moved forward with this.  He is currently using his albuterol a few times a week. Symptoms are stable. He does have daily symptoms of dyspnea, usually with uphill climbing or carrying heavy items.  He is able to walk on level surface without any  difficulties.  He does have a daily, productive cough, which is not particularly bothersome.  Denies any wheezing or increased chest congestion.  He has not had any recent hospitalizations.  Has not required any antibiotics or steroids for his breathing.  He is followed by the lung cancer screening program.  Last CT was 07/06/2022 with lung RADS 2.  Allergies  Allergen Reactions   Wellbutrin [Bupropion] Itching    Immunization History  Administered Date(s) Administered   Influenza Split 05/05/2018    Past Medical History:  Diagnosis Date   GERD (gastroesophageal reflux disease)    Hiatal hernia    High cholesterol     Tobacco History: Social History   Tobacco Use  Smoking Status Former   Packs/day: 1.00   Years: 40.00   Total pack years: 40.00   Types: Cigarettes   Quit date: 2016   Years since quitting: 8.0  Smokeless Tobacco Never  Tobacco Comments   Pt has smoked in 3 years   Counseling given: Not Answered Tobacco comments: Pt has smoked in 3 years   Outpatient Medications Prior to Visit  Medication Sig Dispense Refill   albuterol (VENTOLIN HFA) 108 (90 Base) MCG/ACT inhaler Inhale 2 puffs into the lungs every 6 (six) hours as needed for wheezing or shortness of breath. 1 each 2   Cholecalciferol (VITAMIN D PO) Take by mouth.     ibuprofen (ADVIL,MOTRIN) 800 MG tablet  Take 1 tablet (800 mg total) by mouth 3 (three) times daily. 21 tablet 0   losartan (COZAAR) 25 MG tablet TAKE 1 TABLET (25 MG TOTAL) BY MOUTH DAILY. 90 tablet 1   omega-3 acid ethyl esters (LOVAZA) 1 G capsule Take by mouth 2 (two) times daily.     omeprazole (PRILOSEC) 20 MG capsule Take 1 capsule by mouth daily.     rosuvastatin (CRESTOR) 20 MG tablet TAKE 1 TABLET BY MOUTH EVERY DAY 90 tablet 3   sildenafil (VIAGRA) 100 MG tablet Take 1 tablet by mouth as needed.     tamsulosin (FLOMAX) 0.4 MG CAPS capsule Take 0.4 mg by mouth daily.     furosemide (LASIX) 20 MG tablet TAKE 1 TABLET BY MOUTH EVERY  DAY AS NEEDED (Patient not taking: Reported on 10/05/2022) 90 tablet 1   umeclidinium-vilanterol (ANORO ELLIPTA) 62.5-25 MCG/ACT AEPB Inhale 1 puff into the lungs daily. (Patient not taking: Reported on 10/05/2022) 14 each 4   No facility-administered medications prior to visit.     Review of Systems:   Constitutional: No weight loss or gain, night sweats, fevers, chills, fatigue, or lassitude. HEENT: No headaches, difficulty swallowing, tooth/dental problems, or sore throat. No sneezing, itching, ear ache, nasal congestion, or post nasal drip CV:  No chest pain, orthopnea, PND, swelling in lower extremities, anasarca, dizziness, palpitations, syncope Resp: +shortness of breath with strenuous exertion; daily productive cough. No hemoptysis. No wheezing.  No chest wall deformity GI:  No heartburn, indigestion, abdominal pain, nausea, vomiting, diarrhea, change in bowel habits, loss of appetite, bloody stools.  GU: No dysuria, change in color of urine, urgency or frequency.   Skin: No rash, lesions, ulcerations MSK:  No joint pain or swelling. Neuro: No dizziness or lightheadedness.  Psych: No depression or anxiety. Mood stable.     Physical Exam:  BP 102/72   Pulse 87   Ht 6' 0.5" (1.842 m)   Wt 200 lb 9.6 oz (91 kg)   SpO2 98%   BMI 26.83 kg/m   GEN: Pleasant, interactive, well-appearing; in no acute distress. HEENT:  Normocephalic and atraumatic. PERRLA. Sclera white. Nasal turbinates pink, moist and patent bilaterally. No rhinorrhea present. Oropharynx pink and moist, without exudate or edema. No lesions, ulcerations, or postnasal drip.  NECK:  Supple w/ fair ROM. No JVD present. Normal carotid impulses w/o bruits. Thyroid symmetrical with no goiter or nodules palpated. No lymphadenopathy.   CV: RRR, no m/r/g, no peripheral edema. Pulses intact, +2 bilaterally. No cyanosis, pallor or clubbing. PULMONARY:  Unlabored, regular breathing. Diminished bilaterally A&P w/o  wheezes/rales/rhonchi. No accessory muscle use.  GI: BS present and normoactive. Soft, non-tender to palpation. No organomegaly or masses detected.  MSK: No erythema, warmth or tenderness. Cap refil <2 sec all extrem. No deformities or joint swelling noted.  Neuro: A/Ox3. No focal deficits noted.   Skin: Warm, no lesions or rashe Psych: Normal affect and behavior. Judgement and thought content appropriate.     Lab Results:  CBC    Component Value Date/Time   WBC 8.6 10/25/2010 0102   RBC 5.65 10/25/2010 0102   HGB 16.1 10/25/2010 0102   HCT 46.0 10/25/2010 0102   PLT 276 10/25/2010 0102   MCV 81.4 10/25/2010 0102   MCH 28.5 10/25/2010 0102   MCHC 35.0 10/25/2010 0102   RDW 12.8 10/25/2010 0102   LYMPHSABS 2.7 10/25/2010 0102   MONOABS 0.6 10/25/2010 0102   EOSABS 0.1 10/25/2010 0102   BASOSABS 0.1 10/25/2010 0102  BMET    Component Value Date/Time   NA 138 06/21/2021 1109   K 4.5 06/21/2021 1109   CL 102 06/21/2021 1109   CO2 21 06/21/2021 1109   GLUCOSE 96 06/21/2021 1109   GLUCOSE 124 (H) 10/25/2010 0102   BUN 20 06/21/2021 1109   CREATININE 1.05 06/21/2021 1109   CALCIUM 9.8 06/21/2021 1109   GFRNONAA 74 05/21/2020 0847   GFRAA 86 05/21/2020 0847    BNP    Component Value Date/Time   BNP 15.8 05/21/2020 0847     Imaging:  No results found.       Latest Ref Rng & Units 01/27/2020    2:40 PM  PFT Results  FVC-Pre L 4.33  P  FVC-Predicted Pre % 84  P  FVC-Post L 4.85  P  FVC-Predicted Post % 94  P  Pre FEV1/FVC % % 57  P  Post FEV1/FCV % % 55  P  FEV1-Pre L 2.48  P  FEV1-Predicted Pre % 64  P  FEV1-Post L 2.65  P  DLCO uncorrected ml/min/mmHg 20.10  P  DLCO UNC% % 68  P  DLCO corrected ml/min/mmHg 20.10  P  DLCO COR %Predicted % 68  P  DLVA Predicted % 59  P    P Preliminary result    No results found for: "NITRICOXIDE"      Assessment & Plan:   Centrilobular emphysema (HCC) Moderate COPD with emphysema and chronic bronchitis.  He is not currently on any maintenance therapies due to issues with insurance and affordability. He has since gotten new insurance so we will send test claims to prior auth team to determine what LABA/LAMA therapy options are covered. Medication education provided. Action plan in place. Encouraged to remain active.  Patient Instructions  Continue Albuterol inhaler 2 puffs every 6 hours as needed for shortness of breath or wheezing. Notify if symptoms persist despite rescue inhaler/neb use.  Can use mucinex [phone] mg Twice daily as needed for chest congestion  I will let you know which inhaler is most affordable with your current insurance. I hope to hear back by tomorrow or Monday.  Follow up in 6 weeks to see how new inhaler is going with Dr. Elsworth Soho or Alanson Aly. If symptoms worsen, please contact office for sooner follow up or seek emergency care.    Pulmonary nodule Former smoker. Follow up with lung cancer screening program as scheduled, 07/2023.    I spent 28 minutes of dedicated to the care of this patient on the date of this encounter to include pre-visit review of records, face-to-face time with the patient discussing conditions above, post visit ordering of testing, clinical documentation with the electronic health record, making appropriate referrals as documented, and communicating necessary findings to members of the patients care team.  Clayton Bibles, NP 10/05/2022  Pt aware and understands NP's role.

## 2022-10-05 NOTE — Assessment & Plan Note (Addendum)
Moderate COPD with emphysema and chronic bronchitis. He is not currently on any maintenance therapies due to issues with insurance and affordability. He has since gotten new insurance so we will send test claims to prior auth team to determine what LABA/LAMA therapy options are covered. Medication education provided. Action plan in place. Encouraged to remain active.  Patient Instructions  Continue Albuterol inhaler 2 puffs every 6 hours as needed for shortness of breath or wheezing. Notify if symptoms persist despite rescue inhaler/neb use.  Can use mucinex [phone] mg Twice daily as needed for chest congestion  I will let you know which inhaler is most affordable with your current insurance. I hope to hear back by tomorrow or Monday.  Follow up in 6 weeks to see how new inhaler is going with Dr. Elsworth Soho or Alanson Aly. If symptoms worsen, please contact office for sooner follow up or seek emergency care.

## 2022-10-05 NOTE — Assessment & Plan Note (Signed)
Former smoker. Follow up with lung cancer screening program as scheduled, 07/2023.

## 2022-10-05 NOTE — Patient Instructions (Addendum)
Continue Albuterol inhaler 2 puffs every 6 hours as needed for shortness of breath or wheezing. Notify if symptoms persist despite rescue inhaler/neb use.  Can use mucinex [phone] mg Twice daily as needed for chest congestion  I will let you know which inhaler is most affordable with your current insurance. I hope to hear back by tomorrow or Monday.  Follow up in 6 weeks to see how new inhaler is going with Dr. Elsworth Soho or Alanson Aly. If symptoms worsen, please contact office for sooner follow up or seek emergency care.

## 2022-10-10 ENCOUNTER — Other Ambulatory Visit (HOSPITAL_COMMUNITY): Payer: Self-pay

## 2022-10-10 NOTE — Telephone Encounter (Signed)
Test claims for LABA+LAMA return the following:  Anoro  $20   Stiolto  non-formulary [employer]  non-formulary

## 2022-10-11 MED ORDER — ANORO ELLIPTA 62.5-25 MCG/ACT IN AEPB: 1.0000 | INHALATION_SPRAY | Freq: Every day | RESPIRATORY_TRACT | 5 refills | Status: DC

## 2022-10-11 NOTE — Telephone Encounter (Signed)
Please let patient know that Anoro is showing as a covered inhaler for $20 copay. I have already sent an updated rx. Thanks.

## 2022-11-16 ENCOUNTER — Ambulatory Visit (INDEPENDENT_AMBULATORY_CARE_PROVIDER_SITE_OTHER): Payer: 59 | Admitting: Nurse Practitioner

## 2022-11-16 ENCOUNTER — Encounter: Payer: Self-pay | Admitting: Nurse Practitioner

## 2022-11-16 VITALS — BP 118/74 | HR 76 | Ht 72.0 in | Wt 193.0 lb

## 2022-11-16 DIAGNOSIS — R911 Solitary pulmonary nodule: Secondary | ICD-10-CM | POA: Diagnosis not present

## 2022-11-16 DIAGNOSIS — J432 Centrilobular emphysema: Secondary | ICD-10-CM | POA: Diagnosis not present

## 2022-11-16 NOTE — Assessment & Plan Note (Signed)
Former smoker. Followed by lung cancer screening program. Next LDCT chest due 07/2023.

## 2022-11-16 NOTE — Patient Instructions (Addendum)
Continue Albuterol inhaler 2 puffs every 6 hours as needed for shortness of breath or wheezing. Notify if symptoms persist despite rescue inhaler/neb use.  Can use mucinex [phone] mg Twice daily as needed for chest congestion Continue Anoro 1 puff daily  Add on daily antihistamine such as claritin, zyrtec, xyzal or allegra for allergies    Follow up in 6 months with Dr. Elsworth Soho. If symptoms worsen, please contact office for sooner follow up or seek emergency care.

## 2022-11-16 NOTE — Progress Notes (Signed)
$'@Patient'h$  ID: Isaac Casey, male    DOB: [birth year], 62 y.o.   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 He is a patient of Dr. Bari Mantis and last seen in office 10/05/2022 by [facility] NP. Past medical history significant for CAD, HTN.  TEST/EVENTS:  01/27/2020 PFT: FVC 84, FEV1 64, ratio 55, DLCO 68.  No BD. 07/06/2022 LDCT chest: Atherosclerosis.  No LAD.  Tiny hiatal hernia.  Moderate centrilobular emphysema.  Pleural-based right-sided pulmonary nodules, largest 3.3 mm from 6.5 mm on previous exam.  Lung RADS 2. 08/02/2022 echo: EF 55 to 60%.  Normal diastolic filling pattern.  No evidence of PH.  No significant valvular abnormalities.  04/04/2022: OV with Dr. Elsworth Soho.  Previous pulmonary function testing with moderate airway obstruction.  He was started on Anoro at a previous visit but was unable to continue due to cost issues.  He did have an episode of bronchitis about a month ago which really put him down and took him a few weeks to recover.  Agreeable to restarting maintenance therapy.  Rx resent for Anoro.  If expensive, alternative would be Stiolto.  Referred back to the lung cancer screening program as he is overdue for LDCT chest.  10/05/2022: OV with Stpehen Petitjean NP for follow-up.  He was last seen in August with Dr. Elsworth Soho.  He was going to start on LAMA/LABA therapy; however, he had some trouble with insurance coverage and cost so he never moved forward with this.  He is currently using his albuterol a few times a week. Symptoms are stable. He does have daily symptoms of dyspnea, usually with uphill climbing or carrying heavy items.  He is able to walk on level surface without any difficulties.  He does have a daily, productive  cough, which is not particularly bothersome.  Denies any wheezing or increased chest congestion.  He has not had any recent hospitalizations.  Has not required any antibiotics or steroids for his breathing.  He is followed by the lung cancer screening program.  Last CT was 07/06/2022 with lung RADS 2.  11/16/2022: Today - follow up Patient presents today for follow up after being restarted on Anoro. 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 Noticed some increased chest congestion and cough. Since being home, he's feeling back to normal. He does feel like the Anoro is helping reduce his dyspneic symptoms. Cough mostly back to baseline. He denies any fevers, chills, hemoptysis, orthopnea, leg swelling. He is curious what he should do during allergy season to help combat flares.   Allergies  Allergen Reactions   Wellbutrin [Bupropion] Itching    Immunization History  Administered Date(s) Administered   Influenza Split 05/05/2018    Past Medical History:  Diagnosis Date   GERD (gastroesophageal reflux disease)    Hiatal hernia    High cholesterol     Tobacco History: Social History   Tobacco Use  Smoking Status Former   Packs/day: 1.00   Years: 40.00   Additional pack years: 0.00   Total pack years: 40.00   Types: Cigarettes   Quit date: 2016   Years since quitting: 8.2  Smokeless Tobacco Never  Counseling given: Not Answered   Outpatient Medications Prior to Visit  Medication Sig Dispense Refill   albuterol (VENTOLIN HFA) 108 (90 Base) MCG/ACT inhaler Inhale 2 puffs into the lungs every 6 (six) hours as needed for wheezing or shortness of breath. 1 each 2   Cholecalciferol (VITAMIN D PO) Take by mouth.     furosemide (LASIX) 20 MG tablet TAKE 1 TABLET BY MOUTH EVERY DAY AS NEEDED 90 tablet 1   ibuprofen (ADVIL,MOTRIN) 800 MG tablet Take 1 tablet (800 mg total) by mouth 3 (three) times daily. 21 tablet 0   losartan (COZAAR) 25 MG tablet TAKE 1 TABLET  (25 MG TOTAL) BY MOUTH DAILY. 90 tablet 1   omega-3 acid ethyl esters (LOVAZA) 1 G capsule Take by mouth 2 (two) times daily.     omeprazole (PRILOSEC) 20 MG capsule Take 1 capsule by mouth daily.     rosuvastatin (CRESTOR) 20 MG tablet TAKE 1 TABLET BY MOUTH EVERY DAY 90 tablet 3   sildenafil (VIAGRA) 100 MG tablet Take 1 tablet by mouth as needed.     tamsulosin (FLOMAX) 0.4 MG CAPS capsule Take 0.4 mg by mouth daily.     umeclidinium-vilanterol (ANORO ELLIPTA) 62.5-25 MCG/ACT AEPB Inhale 1 puff into the lungs daily. 60 each 5   No facility-administered medications prior to visit.     Review of Systems:   Constitutional: No weight loss or gain, night sweats, fevers, chills, fatigue, or lassitude. HEENT: No headaches, difficulty swallowing, tooth/dental problems, or sore throat. No sneezing, itching, ear ache, nasal congestion, or post nasal drip CV:  No chest pain, orthopnea, PND, swelling in lower extremities, anasarca, dizziness, palpitations, syncope Resp: +shortness of breath with strenuous exertion; daily productive cough. No hemoptysis. No wheezing.  No chest wall deformity GI:  No heartburn, indigestion, abdominal pain, nausea, vomiting, diarrhea, change in bowel habits, loss of appetite, bloody stools.  GU: No dysuria, change in color of urine, urgency or frequency.   Skin: No rash, lesions, ulcerations MSK:  No joint pain or swelling. Neuro: No dizziness or lightheadedness.  Psych: No depression or anxiety. Mood stable.     Physical Exam:  BP 118/74   Pulse 76   Ht 6' (1.829 m)   Wt 193 lb (87.5 kg)   SpO2 96%   BMI 26.18 kg/m   GEN: Pleasant, interactive, well-appearing; in no acute distress. HEENT:  Normocephalic and atraumatic. PERRLA. Sclera white. Nasal turbinates pink, moist and patent bilaterally. No rhinorrhea present. Oropharynx pink and moist, without exudate or edema. No lesions, ulcerations, or postnasal drip.  NECK:  Supple w/ fair ROM. No JVD present.  Normal carotid impulses w/o bruits. Thyroid symmetrical with no goiter or nodules palpated. No lymphadenopathy.   CV: RRR, no m/r/g, no peripheral edema. Pulses intact, +2 bilaterally. No cyanosis, pallor or clubbing. PULMONARY:  Unlabored, regular breathing. Diminished bilaterally A&P w/o wheezes/rales/rhonchi. No accessory muscle use.  GI: BS present and normoactive. Soft, non-tender to palpation. No organomegaly or masses detected.  MSK: No erythema, warmth or tenderness. Cap refil <2 sec all extrem. No deformities or joint swelling noted.  Neuro: A/Ox3. No focal deficits noted.   Skin: Warm, no lesions or rashe Psych: Normal affect and behavior. Judgement and thought content appropriate.     Lab Results:  CBC    Component Value Date/Time   WBC 8.6 10/25/2010 0102   RBC 5.65 10/25/2010 0102   HGB 16.1 10/25/2010 0102   HCT 46.0 10/25/2010 0102   PLT 276 10/25/2010  0102   MCV 81.4 10/25/2010 0102   MCH 28.5 10/25/2010 0102   MCHC 35.0 10/25/2010 0102   RDW 12.8 10/25/2010 0102   LYMPHSABS 2.7 10/25/2010 0102   MONOABS 0.6 10/25/2010 0102   EOSABS 0.1 10/25/2010 0102   BASOSABS 0.1 10/25/2010 0102    BMET    Component Value Date/Time   NA 138 06/21/2021 1109   K 4.5 06/21/2021 1109   CL 102 06/21/2021 1109   CO2 21 06/21/2021 1109   GLUCOSE 96 06/21/2021 1109   GLUCOSE 124 (H) 10/25/2010 0102   BUN 20 06/21/2021 1109   CREATININE 1.05 06/21/2021 1109   CALCIUM 9.8 06/21/2021 1109   GFRNONAA 74 05/21/2020 0847   GFRAA 86 05/21/2020 0847    BNP    Component Value Date/Time   BNP 15.8 05/21/2020 0847     Imaging:  No results found.       Latest Ref Rng & Units 01/27/2020    2:40 PM  PFT Results  FVC-Pre L 4.33  P  FVC-Predicted Pre % 84  P  FVC-Post L 4.85  P  FVC-Predicted Post % 94  P  Pre FEV1/FVC % % 57  P  Post FEV1/FCV % % 55  P  FEV1-Pre L 2.48  P  FEV1-Predicted Pre % 64  P  FEV1-Post L 2.65  P  DLCO uncorrected ml/min/mmHg 20.10  P  DLCO  [facility]% % 68  P  DLCO corrected ml/min/mmHg 20.10  P  DLCO COR %Predicted % 68  P  DLVA Predicted % 59  P    P Preliminary result    No results found for: "NITRICOXIDE"      Assessment & Plan:   Centrilobular emphysema (HCC) Moderate OSA. Compensated on current regimen. He has received good benefit with LABA/LAMA therapy. We will continue him on Anoro. Add on daily antihistamine for trigger prevention. We also discussed utilizing a respirator type mask when working in dusty environments. Action plan in place.  Patient Instructions  Continue Albuterol inhaler 2 puffs every 6 hours as needed for shortness of breath or wheezing. Notify if symptoms persist despite rescue inhaler/neb use.  Can use mucinex [phone] mg Twice daily as needed for chest congestion Continue Anoro 1 puff daily  Add on daily antihistamine such as claritin, zyrtec, xyzal or allegra for allergies    Follow up in 6 months with Dr. Elsworth Soho. If symptoms worsen, please contact office for sooner follow up or seek emergency care.   Pulmonary nodule Former smoker. Followed by lung cancer screening program. Next LDCT chest due 07/2023.    I spent 32 minutes of dedicated to the care of this patient on the date of this encounter to include pre-visit review of records, face-to-face time with the patient discussing conditions above, post visit ordering of testing, clinical documentation with the electronic health record, making appropriate referrals as documented, and communicating necessary findings to members of the patients care team.  Clayton Bibles, NP 11/16/2022  Pt aware and understands NP's role.

## 2022-11-16 NOTE — Assessment & Plan Note (Signed)
Moderate OSA. Compensated on current regimen. He has received good benefit with LABA/LAMA therapy. We will continue him on Anoro. Add on daily antihistamine for trigger prevention. We also discussed utilizing a respirator type mask when working in dusty environments. Action plan in place.  Patient Instructions  Continue Albuterol inhaler 2 puffs every 6 hours as needed for shortness of breath or wheezing. Notify if symptoms persist despite rescue inhaler/neb use.  Can use mucinex [phone] mg Twice daily as needed for chest congestion Continue Anoro 1 puff daily  Add on daily antihistamine such as claritin, zyrtec, xyzal or allegra for allergies    Follow up in 6 months with Dr. Elsworth Soho. If symptoms worsen, please contact office for sooner follow up or seek emergency care.

## 2023-01-04 ENCOUNTER — Other Ambulatory Visit: Payer: Self-pay | Admitting: Cardiology

## 2023-01-04 DIAGNOSIS — R0609 Other forms of dyspnea: Secondary | ICD-10-CM

## 2023-04-05 ENCOUNTER — Ambulatory Visit
Admission: RE | Admit: 2023-04-05 | Discharge: 2023-04-05 | Disposition: A | Discharge status: Home / Self Care | Payer: 59 | Source: Ambulatory Visit | Attending: Family Medicine | Admitting: Family Medicine

## 2023-04-05 ENCOUNTER — Other Ambulatory Visit: Payer: Self-pay | Admitting: Family Medicine

## 2023-04-05 DIAGNOSIS — R0602 Shortness of breath: Secondary | ICD-10-CM

## 2023-04-19 ENCOUNTER — Encounter: Payer: Self-pay | Admitting: Cardiology

## 2023-04-19 ENCOUNTER — Ambulatory Visit: Payer: Commercial Managed Care - HMO | Admitting: Cardiology

## 2023-04-19 VITALS — BP 112/71 | HR 69

## 2023-04-19 DIAGNOSIS — R0601 Orthopnea: Secondary | ICD-10-CM

## 2023-04-19 DIAGNOSIS — R072 Precordial pain: Secondary | ICD-10-CM

## 2023-04-19 NOTE — Progress Notes (Signed)
Patient is here for follow up visit.  Subjective:   @Patient  ID: Isaac Casey, male    DOB: Oct 17, 1960, 62 y.o.   MRN: [id number]   Chief Complaint  Patient presents with   Chest Pain     HPI  62 y.o. Caucasian male, former smoker, w/COPD, coronary and aortic atherosclerosis  Last few days, patient has had episodes of waking up from sleep middle of night with chest pain and shortness of breath. He also reports shortness of breath on walking at times but not other times. Inhaler improved shortness of breath but also increased his heart rate.     Current Outpatient Medications:    albuterol (VENTOLIN HFA) 108 (90 Base) MCG/ACT inhaler, Inhale 2 puffs into the lungs every 6 (six) hours as needed for wheezing or shortness of breath., Disp: 1 each, Rfl: 2   Cholecalciferol (VITAMIN D PO), Take by mouth., Disp: , Rfl:    furosemide (LASIX) 20 MG tablet, TAKE 1 TABLET BY MOUTH EVERY DAY AS NEEDED, Disp: 90 tablet, Rfl: 1   ibuprofen (ADVIL,MOTRIN) 800 MG tablet, Take 1 tablet (800 mg total) by mouth 3 (three) times daily., Disp: 21 tablet, Rfl: 0   losartan (COZAAR) 25 MG tablet, TAKE 1 TABLET (25 MG TOTAL) BY MOUTH DAILY., Disp: 30 tablet, Rfl: 5   omega-3 acid ethyl esters (LOVAZA) 1 G capsule, Take by mouth 2 (two) times daily., Disp: , Rfl:    omeprazole (PRILOSEC) 20 MG capsule, Take 1 capsule by mouth daily., Disp: , Rfl:    rosuvastatin (CRESTOR) 20 MG tablet, TAKE 1 TABLET BY MOUTH EVERY DAY, Disp: 90 tablet, Rfl: 3   sildenafil (VIAGRA) 100 MG tablet, Take 1 tablet by mouth as needed., Disp: , Rfl:    umeclidinium-vilanterol (ANORO ELLIPTA) 62.5-25 MCG/ACT AEPB, Inhale 1 puff into the lungs daily., Disp: 60 each, Rfl: 5   ALPRAZolam (XANAX) 0.5 MG tablet, Take 0.5 mg by mouth daily as needed., Disp: , Rfl:    tamsulosin (FLOMAX) 0.4 MG CAPS capsule, Take 0.4 mg by mouth daily. (Patient not taking: Reported on 04/19/2023), Disp: , Rfl:   Cardiovascular studies:   EKG  04/19/2023: Sinus rhythm 66 bpm  Possible old anteroseptal infarct  Echocardiogram 08/02/2022: Normal LV systolic function with visual EF 55-60%. Left ventricle cavity is normal in size. Normal global wall motion. Normal diastolic filling pattern, normal LAP. Calculated EF 57%. Structurally normal tricuspid valve with trace regurgitation. No evidence of pulmonary hypertension. No significant change compared to 06/2021.  Coronary CTA 07/06/2021: 1. Total coronary calcium score of 69.5. This was 59th percentile for age and sex matched control. 2. Normal coronary origin with right dominance. 3. CAD-RADS = 1. Minimal stenosis (0-24%) due to mixed plaque at distal left main, otherwise the vessel is patent. Minimal stenosis (0-24%) proximal LAD due to calcified plaque otherwise mid to distal vessel is patent. LCx: Patent. RCA: Patent. 4. Aortic atherosclerosis. 5. Study is sent for CT-FFR due to distal left main and proximal LAD disease findings will be performed and reported separately.     1. Left Main: FFR = 0.99 2. LAD: Proximal FFR = 0.98, mid FFR = 0.94, distal FFR = 0.87 3. LCX: Proximal FFR = 0.99, distal FFR = 0.96 4. RCA: Proximal FFR = 0.99, mid FFR =0.97, distal FFR = 0.95   IMPRESSION: 1.  CT FFR analysis showed no significant stenosis.  Calcium score:  Left main: 2.14 Left anterior descending artery: 67.4 Left circumflex artery: 0 Right  coronary artery: 0  CT chest 07/2020: 1. Lung-RADS 2, benign appearance or behavior. 2. Coronary artery calcifications noted. Aortic Atherosclerosis (ICD10-I70.0) and Emphysema (ICD10-J43.9).  Echocardiogram 05/26/2020:  Left ventricle cavity is normal in size. Mild concentric hypertrophy of  the left ventricle. Mild global hypokinesis. LVEF 40-45%. Normal diastolic  filling pattern.  Mild (Grade I) mitral regurgitation.  Mild tricuspid regurgitation.  No evidence of pulmonary hypertension.  On direct comparison of images  from study in 2019, there is no significant  change in LVEF.   Exercise myoview stress 08/23/2018: 1. The resting electrocardiogram demonstrated normal sinus rhythm, incomplete RBBB, no resting arrhythmias and normal rest repolarization.  Stress EKG is negative for myocardial ischemia. With peak exercise there was 1 pair of Ventricular couplet and a single PVC.  There is no ST-T wave changes of ischemia. Patient exercised on Bruce protocol for 6:26 minutes and achieved  7.7 METS. Stress test terminated due to  Dyspnea and 87% MPHR achieved (Target HR >85%). Resting blood pressure 142/90 and peak blood pressure 200/84 mmHg. 2. Stress and rest SPECT images demonstrate homogeneous tracer distribution throughout the myocardium. Gated SPECT imaging reveals normal myocardial thickening and wall motion. The left ventricular ejection fraction was calculated at 39%, however visually appears normal.    3. This is an intermediate risk study, clinical correlation and echo correlation recommended.  CT chest lungs screening 06/19/2018: 1. Benign lung appearance or behavior.  Annual CT scan recommended. 2.  Aortic atherosclerosis, left main and left anterior descending coronary artery disease. 3.  Mild diffuse bronchial wall thickening and mild centrilobular and subcutaneous emphysema, suggestive of underlying COPD  Coronary angiogram 11/19/2010: Normal coronaries.  Recent labs: 07/13/2021: Glucose 114, BUN/Cr 17/1.0. EGFR 81. K 4.4. HbA1C 5.6% Chol 117, TG 51, HDL 49, LDL 56   Review of Systems  Cardiovascular:  Positive for chest pain (no recurrence), dyspnea on exertion (Mild, stable) and orthopnea. Negative for claudication, leg swelling, near-syncope, palpitations, paroxysmal nocturnal dyspnea and syncope.  Respiratory:  Negative for shortness of breath.   Neurological:  Negative for dizziness.       Objective:    Vitals:   04/19/23 1320  BP: 112/71  Pulse: 69  SpO2: 96%       Physical  Exam Vitals and nursing note reviewed.  Constitutional:      General: He is not in acute distress. Neck:     Vascular: No JVD.  Cardiovascular:     Rate and Rhythm: Normal rate and regular rhythm.     Heart sounds: Normal heart sounds. No murmur heard. Pulmonary:     Effort: Pulmonary effort is normal.     Breath sounds: Normal breath sounds. No wheezing or rales.  Musculoskeletal:     Right lower leg: No edema.     Left lower leg: No edema.   Physical exam unchanged compared to previous office visit.      Assessment & Recommendations:    62 y.o. Caucasian male, former smoker, w/COPD, coronary and aortic atherosclerosis  Chest pain, orthopnea: Multiple episodes of chest pain and orthopnea, but without any evidence of heart failure on exam, and fairly unremarkable EKG.  CTA coronary angiogram in 07/2021 with nonobstructive coronary artery disease, echocardiogram 07/2022 with normal EF.  I reckon his symptoms could be related to panic attacks.  However, given his mild coronary calcification, I will repeat an echocardiogram, and also obtain Lexiscan nuclear stress test-he states he is unable to walk a treadmill because of shortness of breath.  I will also check troponin and BNP.  If all cardiac tests are unremarkable, I will defer management of possible panic attacks to PCP.  F/u in 6 months    Randye Treichler Emiliano Dyer, PA-C 04/19/2023, 12:26 PM Office: [phone]

## 2023-04-26 ENCOUNTER — Ambulatory Visit: Payer: Commercial Managed Care - HMO

## 2023-04-26 DIAGNOSIS — R0601 Orthopnea: Secondary | ICD-10-CM

## 2023-04-26 DIAGNOSIS — R072 Precordial pain: Secondary | ICD-10-CM

## 2023-05-01 NOTE — Progress Notes (Signed)
Patient understands results.

## 2023-05-05 LAB — TROPONIN T: Troponin T (Highly Sensitive): <6 ng/L (ref 0–22)

## 2023-05-05 LAB — BRAIN NATRIURETIC PEPTIDE: BNP: 41.3 pg/mL (ref 0.0–100.0)

## 2023-05-08 ENCOUNTER — Ambulatory Visit: Payer: Commercial Managed Care - HMO

## 2023-05-08 DIAGNOSIS — R0601 Orthopnea: Secondary | ICD-10-CM

## 2023-05-08 DIAGNOSIS — R072 Precordial pain: Secondary | ICD-10-CM

## 2023-05-09 NOTE — Progress Notes (Signed)
Called and spoke with patient regarding his recent troponin results.

## 2023-05-11 NOTE — Progress Notes (Signed)
Pt aware.

## 2023-06-17 ENCOUNTER — Other Ambulatory Visit: Payer: Self-pay | Admitting: Nurse Practitioner

## 2023-06-17 DIAGNOSIS — J432 Centrilobular emphysema: Secondary | ICD-10-CM

## 2023-07-10 ENCOUNTER — Ambulatory Visit
Admission: RE | Admit: 2023-07-10 | Discharge: 2023-07-10 | Disposition: A | Discharge status: Home / Self Care | Payer: 59 | Source: Ambulatory Visit | Attending: Family Medicine | Admitting: Family Medicine

## 2023-07-10 DIAGNOSIS — Z87891 Personal history of nicotine dependence: Secondary | ICD-10-CM

## 2023-07-10 DIAGNOSIS — Z122 Encounter for screening for malignant neoplasm of respiratory organs: Secondary | ICD-10-CM

## 2023-07-13 ENCOUNTER — Other Ambulatory Visit: Payer: Self-pay

## 2023-07-13 ENCOUNTER — Other Ambulatory Visit: Payer: Self-pay | Admitting: Cardiology

## 2023-07-13 DIAGNOSIS — I25118 Atherosclerotic heart disease of native coronary artery with other forms of angina pectoris: Secondary | ICD-10-CM

## 2023-07-13 DIAGNOSIS — R0609 Other forms of dyspnea: Secondary | ICD-10-CM

## 2023-07-13 MED ORDER — ROSUVASTATIN CALCIUM 20 MG PO TABS
20.0000 mg | ORAL_TABLET | Freq: Every day | ORAL | 2 refills | Status: DC
Start: 2023-07-13 — End: 2024-04-16

## 2023-07-25 ENCOUNTER — Ambulatory Visit: Payer: Commercial Managed Care - HMO | Admitting: Cardiology

## 2023-08-07 ENCOUNTER — Telehealth: Payer: Self-pay | Admitting: Acute Care

## 2023-08-07 NOTE — Telephone Encounter (Signed)
Call Report  

## 2023-08-07 NOTE — Telephone Encounter (Signed)
Spoke with Diane at [facility] for call report:  IMPRESSION: 1. Lung-RADS 4Bs, suspicious. Additional imaging evaluation or consultation with Pulmonology or Thoracic Surgery recommended. 2. The S modifier above refers to the new nodular area within the posterior left base which measures 31.6 mm. For new large nodules that develop on an annual repeat screening CT, consider a 1 month LDCT to address potentially infectious or inflammatory conditions. 3. Coronary artery calcifications. 4. Aortic Atherosclerosis (ICD10-I70.0) and Emphysema (ICD10-J43.9).

## 2023-08-08 ENCOUNTER — Telehealth: Payer: Self-pay | Admitting: Acute Care

## 2023-08-08 DIAGNOSIS — R911 Solitary pulmonary nodule: Secondary | ICD-10-CM

## 2023-08-08 DIAGNOSIS — Z87891 Personal history of nicotine dependence: Secondary | ICD-10-CM

## 2023-08-08 NOTE — Telephone Encounter (Signed)
Results/ plans faxed to PCP. Order placed for repeat Nodule follow up CT to be scheduled now. Pt placed on reminder list to follow for results.

## 2023-08-08 NOTE — Telephone Encounter (Signed)
I have called the patient with the results of his low dose Ct Chest. His scan was read as a LR4 B. He has a new nodular area within the posterior left base which measures 31.6 mm.Recommendation per radiology is that we do a 1 month follow up LDCT Chest to address potentially infectious / inflammatory conditions as it is a new nodule that has developed between annual screenings.   I will order the repeat LDCT for now, as this scan was done 07/10/2023, and it was read 08/07/2023. ( 1 month since original scan)  I will call the patient with the results, and based on results we will determine need for follow up imaging. Pt. States he was having trouble breathing 3 months ago, and did have episodes of coughing up green secretions. He states this had resolved by the time he had the LDCT 07/10/2023. He is no longer coughing up discolored secretions.  Pt does have an appointment with Dr. Vassie Loll 08/17/2023.  Jonita Albee and Allison,Please fax results to PCP. Please place order for LDCT now, and place patient on tickle list / let me know when it results so I can call the patient.Thanks so much

## 2023-08-17 ENCOUNTER — Encounter (HOSPITAL_BASED_OUTPATIENT_CLINIC_OR_DEPARTMENT_OTHER): Payer: Self-pay | Admitting: Pulmonary Disease

## 2023-08-17 ENCOUNTER — Encounter (HOSPITAL_COMMUNITY): Payer: Self-pay

## 2023-08-17 ENCOUNTER — Telehealth (HOSPITAL_COMMUNITY): Payer: Self-pay

## 2023-08-17 ENCOUNTER — Ambulatory Visit (HOSPITAL_BASED_OUTPATIENT_CLINIC_OR_DEPARTMENT_OTHER): Payer: Commercial Managed Care - HMO | Admitting: Pulmonary Disease

## 2023-08-17 VITALS — BP 104/68 | HR 75 | Ht 72.0 in | Wt 189.4 lb

## 2023-08-17 DIAGNOSIS — R911 Solitary pulmonary nodule: Secondary | ICD-10-CM | POA: Diagnosis not present

## 2023-08-17 DIAGNOSIS — J432 Centrilobular emphysema: Secondary | ICD-10-CM

## 2023-08-17 NOTE — Patient Instructions (Addendum)
X Sample of Trelegy 100 once daily, rinse mouth after use Call me for Rx if this works  X [employer] rehab program   X repeat CT chest  X schedule PFTs

## 2023-08-17 NOTE — Progress Notes (Signed)
Subjective:    Patient ID: Isaac Casey, male    DOB: 06/25/1961, 62 y.o.   MRN: [id number]  HPI  62 yo for FU of emphysema &  Dyspnea He smoked more than 40 pack years before he quit in 2016  LDCT chest 07/2023  was read as a LR4 B. He has a new nodular area within the posterior left base which measures 31.6 mm. New compared to 11/2023Recommendation per radiology is that we do a 1 month follow up LDCT Chest he was having trouble breathing 3 months ago, and did have episodes of coughing up green secretions. He states this had resolved by the time he had the LDCT 07/10/2023.    Discussed the use of AI scribe software for clinical note transcription with the patient, who gave verbal consent to proceed.  History of Present Illness   The patient, with a history of COPD, presents with a recent episode of severe shortness of breath and panic attacks, particularly at night. The episode, which started in July, lasted for about a month and was characterized by difficulty in getting a deep, fulfilling breath. The patient reports that the symptoms were worse at night, leading to a fear of going to bed. The symptoms gradually subsided, and the patient was able to sleep again. However, the results of a recent CT scan caused a brief resurgence of anxiety. The patient also reports a change in insurance, which has affected his ability to get his usual COPD medication, Anoro. The patient has been off this medication for about six weeks and did not notice a significant difference when taking it. The patient is interested in supplements like turmeric and ginger for inflammation and is open to an exercise program at the hospital.      Significant tests/ events reviewed   PFTs 01/2020, moderate airway obstruction, ratio 57, FEV1 64%, FVC 84%, DLCO 68% LDCT 07/2020 rads 2 LDCT 06/2019  RADS 2 , Pulmonary nodules measure 4.1 mm or less in size   LDCT 06/2018 - RADS-2S with right upper lobe 5 mm nodule  and mild centrilobular emphysema.  Review of Systems neg for any significant sore throat, dysphagia, itching, sneezing, nasal congestion or excess/ purulent secretions, fever, chills, sweats, unintended wt loss, pleuritic or exertional cp, hempoptysis, orthopnea pnd or change in chronic leg swelling. Also denies presyncope, palpitations, heartburn, abdominal pain, nausea, vomiting, diarrhea or change in bowel or urinary habits, dysuria,hematuria, rash, arthralgias, visual complaints, headache, numbness weakness or ataxia.     Objective:   Physical Exam  Gen. Pleasant, well-nourished, in no distress ENT - no thrush, no pallor/icterus,no post nasal drip Neck: No JVD, no thyromegaly, no carotid bruits Lungs: no use of accessory muscles, no dullness to percussion, clear without rales or rhonchi  Cardiovascular: Rhythm regular, heart sounds  normal, no murmurs or gallops, no peripheral edema Musculoskeletal: No deformities, no cyanosis or clubbing        Assessment & Plan:    Assessment and Plan    Chronic Obstructive Pulmonary Disease (COPD) Experiencing exacerbation symptoms including dyspnea and nocturnal anxiety. Recent CT scan revealed a new patchy area in the posterior left lung base, likely due to infection or inflammation. Off Anoro for six weeks with no significant symptom change. Discussed Trelegy as an alternative for better symptom control. Follow-up CT scan recommended to monitor the new area, expected to shrink if due to infection or inflammation. - Provide Trelegy sample and check insurance coverage - Schedule pulmonary rehabilitation program -  Discuss breathing techniques and panic attack management in rehab program   Pulmonary nodule, not present in 2023 or CXR 04/2023   -- Order 8 wk  follow-up CT scan on 08/31/2023  Possible Pneumonia New patchy area in the posterior left lung base on recent CT scan, likely due to infection or inflammation. Previously treated with  amoxicillin and possibly a steroid pack. Follow-up CT scan recommended to assess changes. - Order follow-up CT scan on 08/31/2023 to assess changes in the patchy area  Anxiety Experiencing anxiety and panic attacks, particularly nocturnally related to breathing difficulties. Symptoms improved with conscious breathing techniques. Pulmonary rehabilitation recommended for anxiety management, including techniques like pursed-lip and abdominal breathing. - Include anxiety management in pulmonary rehabilitation program - Educate on breathing techniques such as pursed-lip and abdominal breathing  General Health Maintenance Improving diet by increasing fruit and vegetable intake and avoiding fried foods, beneficial for overall and cardiac health. - Encourage continued healthy diet changes - Reinforce the importance of a balanced diet for cardiac health  Follow-up - Schedule follow-up appointment after 08/31/2023 CT scan - Review CT scan results and adjust treatment plan accordingly.

## 2023-08-17 NOTE — Assessment & Plan Note (Signed)
x

## 2023-08-17 NOTE — Telephone Encounter (Signed)
Attempted to call patient in regards to Pulmonary Rehab - LM on VM Mailed letter 

## 2023-08-24 ENCOUNTER — Encounter (HOSPITAL_COMMUNITY)
Admission: RE | Admit: 2023-08-24 | Discharge: 2023-08-24 | Disposition: A | Discharge status: Home / Self Care | Payer: 59 | Source: Ambulatory Visit | Attending: Pulmonary Disease | Admitting: Pulmonary Disease

## 2023-08-24 ENCOUNTER — Encounter (HOSPITAL_COMMUNITY): Payer: Self-pay

## 2023-08-24 VITALS — BP 124/72 | HR 84 | Wt 193.6 lb

## 2023-08-24 DIAGNOSIS — J449 Chronic obstructive pulmonary disease, unspecified: Secondary | ICD-10-CM | POA: Diagnosis present

## 2023-08-24 NOTE — Progress Notes (Signed)
Isaac Casey 62 y.o. male Pulmonary Rehab Orientation Note This patient who was referred to Pulmonary Rehab by Dr. Vassie Loll with the diagnosis of COPD 2 arrived today in Cardiac and Pulmonary Rehab. He arrived ambulatory with limp gait. He does not carry portable oxygen.  Per patient, Isaac Casey uses oxygen never. Color good, skin warm and dry. Patient is oriented to time and place. Patient's medical history, psychosocial health, and medications reviewed. Psychosocial assessment reveals patient lives with spouse. Isaac Casey is currently retired. Patient hobbies include  playing golf and riding motorcycles . Patient reports his stress level is low. Areas of stress/anxiety include health. Patient does not exhibit signs of depression. PHQ2/9 score 0/1. Isaac Casey shows good  coping skills with positive outlook on life. Offered emotional support and reassurance. Will continue to monitor. Physical assessment performed by Nurse pick: Essie Hart RN. Please see their orientation physical assessment note. Isaac Casey reports he  does take medications as prescribed. Patient states he  follows a regular  diet. The patient reports no specific efforts to gain or lose weight.. Patient's weight will be monitored closely. Demonstration and practice of PLB using pulse oximeter. Isaac Casey able to return demonstration satisfactorily. Safety and hand hygiene in the exercise area reviewed with patient. Isaac Casey voices understanding of the information reviewed. Department expectations discussed with patient and achievable goals were set. The patient shows enthusiasm about attending the program and we look forward to working with Isaac Casey completed a 6 min walk test today and is scheduled to begin exercise on 08/30/23 @10 :15.   1000-1200 Isaac Casey, BSRT

## 2023-08-24 NOTE — Progress Notes (Signed)
 Pulmonary Rehab Orientation Physical Assessment Note  Physical assessment reveals patient is alert and oriented x 4. Heart rate is normal, breath sounds clear to auscultation, no wheezes, rales, or rhonchi. Reports productive/non-productive cough. Bowel sounds present x4 quads. Pt denies abdominal discomfort, nausea, vomiting, diarrhea or constipation. Grip strength equal, strong. Distal pulses +2; no swelling to lower extremities.   Essie Hart, RN, BSN

## 2023-08-24 NOTE — Progress Notes (Signed)
Pulmonary Individual Treatment Plan  Patient Details  Name: Isaac Casey MRN: [id number] Date of Birth: [birth year] Referring Provider:   Doristine Devoid Pulmonary Rehab Walk Test from 08/24/2023 in [facility]  Referring Provider Vassie Loll       Initial Encounter Date:  Flowsheet Row Pulmonary Rehab Walk Test from 08/24/2023 in [facility]  Date 08/24/23       Visit Diagnosis: Stage 2 moderate COPD by GOLD classification (HCC)  Patient's Home Medications on Admission:   Current Outpatient Medications:    albuterol (VENTOLIN HFA) 108 (90 Base) MCG/ACT inhaler, Inhale 2 puffs into the lungs every 6 (six) hours as needed for wheezing or shortness of breath., Disp: 1 each, Rfl: 2   Cholecalciferol (VITAMIN D PO), Take by mouth., Disp: , Rfl:    finasteride (PROSCAR) 5 MG tablet, Take 5 mg by mouth daily., Disp: , Rfl:    furosemide (LASIX) 20 MG tablet, TAKE 1 TABLET BY MOUTH EVERY DAY AS NEEDED, Disp: 90 tablet, Rfl: 1   ibuprofen (ADVIL,MOTRIN) 800 MG tablet, Take 1 tablet (800 mg total) by mouth 3 (three) times daily., Disp: 21 tablet, Rfl: 0   losartan (COZAAR) 25 MG tablet, TAKE 1 TABLET (25 MG TOTAL) BY MOUTH DAILY., Disp: 90 tablet, Rfl: 3   omega-3 acid ethyl esters (LOVAZA) 1 G capsule, Take by mouth 2 (two) times daily., Disp: , Rfl:    omeprazole (PRILOSEC) 20 MG capsule, Take 1 capsule by mouth daily., Disp: , Rfl:    rosuvastatin (CRESTOR) 20 MG tablet, Take 1 tablet (20 mg total) by mouth daily., Disp: 90 tablet, Rfl: 2   tamsulosin (FLOMAX) 0.4 MG CAPS capsule, Take 0.4 mg by mouth daily., Disp: , Rfl:    ALPRAZolam (XANAX) 0.5 MG tablet, Take 0.5 mg by mouth daily as needed. (Patient not taking: Reported on 08/24/2023), Disp: , Rfl:    ANORO ELLIPTA 62.5-25 MCG/ACT AEPB, TAKE 1 PUFF BY MOUTH EVERY DAY (Patient not taking: Reported on 08/24/2023), Disp:  60 each, Rfl: 5   sertraline (ZOLOFT) 25 MG tablet, Take 25 mg by mouth daily. (Patient not taking: Reported on 08/24/2023), Disp: , Rfl:    sildenafil (VIAGRA) 100 MG tablet, Take 1 tablet by mouth as needed. (Patient not taking: Reported on 08/24/2023), Disp: , Rfl:   Past Medical History: Past Medical History:  Diagnosis Date   GERD (gastroesophageal reflux disease)    Hiatal hernia    High cholesterol     Tobacco Use: Social History   Tobacco Use  Smoking Status Former   Current packs/day: 0.00   Average packs/day: 1 pack/day for 40.0 years (40.0 ttl pk-yrs)   Types: Cigarettes   Start date: 79   Quit date: 2016   Years since quitting: 8.9  Smokeless Tobacco Never    Labs: Review Flowsheet       Latest Ref Rng & Units 05/21/2020  Labs for ITP Cardiac and Pulmonary Rehab  Cholestrol 100 - 199 mg/dL 564   LDL (calc) 0 - 99 mg/dL 68   HDL-C >33 mg/dL 50   Trlycerides 0 - 295 mg/dL 188     Capillary Blood Glucose: No results found for: "GLUCAP"   Pulmonary Assessment Scores:  Pulmonary Assessment Scores     Row Name 08/24/23 1219         ADL UCSD   ADL Phase Entry     SOB Score  total 23       CAT Score   CAT Score 16       mMRC Score   mMRC Score 1             UCSD: Self-administered rating of dyspnea associated with activities of daily living (ADLs) 6-point scale (0 = "not at all" to 5 = "maximal or unable to do because of breathlessness")  Scoring Scores range from 0 to 120.  Minimally important difference is 5 units  CAT: CAT can identify the health impairment of COPD patients and is better correlated with disease progression.  CAT has a scoring range of zero to 40. The CAT score is classified into four groups of low (less than 10), medium (10 - 20), high (21-30) and very high (31-40) based on the impact level of disease on health status. A CAT score over 10 suggests significant symptoms.  A worsening CAT score could be explained by an  exacerbation, poor medication adherence, poor inhaler technique, or progression of COPD or comorbid conditions.  CAT MCID is 2 points  mMRC: mMRC (Modified Medical Research Council) Dyspnea Scale is used to assess the degree of baseline functional disability in patients of respiratory disease due to dyspnea. No minimal important difference is established. A decrease in score of 1 point or greater is considered a positive change.   Pulmonary Function Assessment:  Pulmonary Function Assessment - 08/24/23 1127       Breath   Bilateral Breath Sounds Clear    Shortness of Breath Yes;Limiting activity;Panic with Shortness of Breath             Exercise Target Goals: Exercise Program Goal: Individual exercise prescription set using results from initial 6 min walk test and THRR while considering  patient's activity barriers and safety.   Exercise Prescription Goal: Initial exercise prescription builds to 30-45 minutes a day of aerobic activity, 2-3 days per week.  Home exercise guidelines will be given to patient during program as part of exercise prescription that the participant will acknowledge.  Activity Barriers & Risk Stratification:  Activity Barriers & Cardiac Risk Stratification - 08/24/23 1125       Activity Barriers & Cardiac Risk Stratification   Activity Barriers Arthritis;Back Problems;Deconditioning;Muscular Weakness;Shortness of Breath    Cardiac Risk Stratification Moderate             6 Minute Walk:  6 Minute Walk     Row Name 08/24/23 1202         6 Minute Walk   Phase Initial     Distance 1320 feet     Walk Time 6 minutes     # of Rest Breaks 0     MPH 2.5     METS 3.31     RPE 11     Perceived Dyspnea  1     VO2 Peak 11.59     Symptoms No     Resting HR 84 bpm     Resting BP 124/72     Resting Oxygen Saturation  97 %     Exercise Oxygen Saturation  during 6 min walk 94 %     Max Ex. HR 106 bpm     Max Ex. BP 130/86     2 Minute Post BP  124/80       Interval HR   1 Minute HR 100     2 Minute HR 105     3 Minute HR 98  4 Minute HR 97     5 Minute HR 100     6 Minute HR 106     2 Minute Post HR 87     Interval Heart Rate? Yes       Interval Oxygen   Interval Oxygen? Yes     Baseline Oxygen Saturation % 97 %     1 Minute Oxygen Saturation % 95 %     1 Minute Liters of Oxygen 0 L     2 Minute Oxygen Saturation % 95 %     2 Minute Liters of Oxygen 0 L     3 Minute Oxygen Saturation % 94 %     3 Minute Liters of Oxygen 0 L     4 Minute Oxygen Saturation % 94 %     4 Minute Liters of Oxygen 0 L     5 Minute Oxygen Saturation % 97 %     5 Minute Liters of Oxygen 0 L     6 Minute Oxygen Saturation % 97 %     6 Minute Liters of Oxygen 0 L     2 Minute Post Oxygen Saturation % 99 %     2 Minute Post Liters of Oxygen 0 L              Oxygen Initial Assessment:  Oxygen Initial Assessment - 08/24/23 1126       Home Oxygen   Home Oxygen Device None    Sleep Oxygen Prescription CPAP    Home Exercise Oxygen Prescription None    Home Resting Oxygen Prescription None      Initial 6 min Walk   Oxygen Used None      Program Oxygen Prescription   Program Oxygen Prescription None      Intervention   Short Term Goals To learn and understand importance of monitoring SPO2 with pulse oximeter and demonstrate accurate use of the pulse oximeter.;To learn and understand importance of maintaining oxygen saturations>88%;To learn and demonstrate proper pursed lip breathing techniques or other breathing techniques. ;To learn and demonstrate proper use of respiratory medications    Long  Term Goals Maintenance of O2 saturations>88%;Compliance with respiratory medication;Verbalizes importance of monitoring SPO2 with pulse oximeter and return demonstration;Exhibits proper breathing techniques, such as pursed lip breathing or other method taught during program session;Demonstrates proper use of MDI's             Oxygen  Re-Evaluation:   Oxygen Discharge (Final Oxygen Re-Evaluation):   Initial Exercise Prescription:  Initial Exercise Prescription - 08/24/23 1200       Date of Initial Exercise RX and Referring Provider   Date 08/24/23    Referring Provider Vassie Loll    Expected Discharge Date 11/20/22      Treadmill   MPH 2.5    Grade 0    Minutes 15    METs 5      Elliptical   Level 8    Speed 7    Minutes 15    METs 6      Prescription Details   Frequency (times per week) 2    Duration Progress to 30 minutes of continuous aerobic without signs/symptoms of physical distress      Intensity   THRR 40-80% of Max Heartrate 63-126    Ratings of Perceived Exertion 11-13    Perceived Dyspnea 0-4      Progression   Progression Continue to progress workloads to maintain intensity without signs/symptoms of physical distress.  Resistance Training   Training Prescription Yes    Weight black bands    Reps 10-15             Perform Capillary Blood Glucose checks as needed.  Exercise Prescription Changes:   Exercise Comments:   Exercise Goals and Review:   Exercise Goals     Row Name 08/24/23 1126             Exercise Goals   Increase Physical Activity Yes       Intervention Provide advice, education, support and counseling about physical activity/exercise needs.;Develop an individualized exercise prescription for aerobic and resistive training based on initial evaluation findings, risk stratification, comorbidities and participant's personal goals.       Expected Outcomes Short Term: Attend rehab on a regular basis to increase amount of physical activity.;Long Term: Add in home exercise to make exercise part of routine and to increase amount of physical activity.;Long Term: Exercising regularly at least 3-5 days a week.       Increase Strength and Stamina Yes       Intervention Provide advice, education, support and counseling about physical activity/exercise needs.;Develop  an individualized exercise prescription for aerobic and resistive training based on initial evaluation findings, risk stratification, comorbidities and participant's personal goals.       Expected Outcomes Short Term: Increase workloads from initial exercise prescription for resistance, speed, and METs.;Short Term: Perform resistance training exercises routinely during rehab and add in resistance training at home;Long Term: Improve cardiorespiratory fitness, muscular endurance and strength as measured by increased METs and functional capacity ( )       Able to understand and use rate of perceived exertion (RPE) scale Yes       Intervention Provide education and explanation on how to use RPE scale       Expected Outcomes Short Term: Able to use RPE daily in rehab to express subjective intensity level;Long Term:  Able to use RPE to guide intensity level when exercising independently       Able to understand and use Dyspnea scale Yes       Intervention Provide education and explanation on how to use Dyspnea scale       Expected Outcomes Short Term: Able to use Dyspnea scale daily in rehab to express subjective sense of shortness of breath during exertion;Long Term: Able to use Dyspnea scale to guide intensity level when exercising independently       Knowledge and understanding of Target Heart Rate Range (THRR) Yes       Intervention Provide education and explanation of THRR including how the numbers were predicted and where they are located for reference       Expected Outcomes Short Term: Able to state/look up THRR;Long Term: Able to use THRR to govern intensity when exercising independently;Short Term: Able to use daily as guideline for intensity in rehab       Understanding of Exercise Prescription Yes       Intervention Provide education, explanation, and written materials on patient's individual exercise prescription       Expected Outcomes Short Term: Able to explain program exercise  prescription;Long Term: Able to explain home exercise prescription to exercise independently                Exercise Goals Re-Evaluation :   Discharge Exercise Prescription (Final Exercise Prescription Changes):   Nutrition:  Target Goals: Understanding of nutrition guidelines, daily intake of sodium 1500mg , cholesterol 200mg , calories 30% from fat and  7% or less from saturated fats, daily to have 5 or more servings of fruits and vegetables.  Biometrics:  Pre Biometrics - 08/24/23 1049       Pre Biometrics   Grip Strength 60 kg              Nutrition Therapy Plan and Nutrition Goals:   Nutrition Assessments:  MEDIFICTS Score Key: >=70 Need to make dietary changes  40-70 Heart Healthy Diet <= 40 Therapeutic Level Cholesterol Diet   Picture Your Plate Scores: <96 Unhealthy dietary pattern with much room for improvement. 41-50 Dietary pattern unlikely to meet recommendations for good health and room for improvement. 51-60 More healthful dietary pattern, with some room for improvement.  >60 Healthy dietary pattern, although there may be some specific behaviors that could be improved.    Nutrition Goals Re-Evaluation:   Nutrition Goals Discharge (Final Nutrition Goals Re-Evaluation):   Psychosocial: Target Goals: Acknowledge presence or absence of significant depression and/or stress, maximize coping skills, provide positive support system. Participant is able to verbalize types and ability to use techniques and skills needed for reducing stress and depression.  Initial Review & Psychosocial Screening:  Initial Psych Review & Screening - 08/24/23 1119       Initial Review   Current issues with None Identified      Family Dynamics   Good Support System? Yes      Barriers   Psychosocial barriers to participate in program There are no identifiable barriers or psychosocial needs.      Screening Interventions   Interventions Encouraged to exercise              Quality of Life Scores:  Scores of 19 and below usually indicate a poorer quality of life in these areas.  A difference of  2-3 points is a clinically meaningful difference.  A difference of 2-3 points in the total score of the Quality of Life Index has been associated with significant improvement in overall quality of life, self-image, physical symptoms, and general health in studies assessing change in quality of life.  PHQ-9: Review Flowsheet       08/24/2023  Depression screen PHQ 2/9  Decreased Interest 0  Down, Depressed, Hopeless 0  PHQ - 2 Score 0  Altered sleeping 1  Tired, decreased energy 0  Change in appetite 0  Feeling bad or failure about yourself  0  Trouble concentrating 0  Moving slowly or fidgety/restless 0  Suicidal thoughts 0  PHQ-9 Score 1  Difficult doing work/chores Not difficult at all   Interpretation of Total Score  Total Score Depression Severity:  1-4 = Minimal depression, 5-9 = Mild depression, 10-14 = Moderate depression, 15-19 = Moderately severe depression, 20-27 = Severe depression   Psychosocial Evaluation and Intervention:  Psychosocial Evaluation - 08/24/23 1123       Psychosocial Evaluation & Interventions   Interventions Encouraged to exercise with the program and follow exercise prescription    Comments Shabaka denies any psychosocial barriers or concerns at this time.    Expected Outcomes For Cl[place]on to participate in PR free of any psychosocial barriers or concerns    Continue Psychosocial Services  No Follow up required             Psychosocial Re-Evaluation:   Psychosocial Discharge (Final Psychosocial Re-Evaluation):   Education: Education Goals: Education classes will be provided on a weekly basis, covering required topics. Participant will state understanding/return demonstration of topics presented.  Learning Barriers/Preferences:  Learning Barriers/Preferences -  08/24/23 1123       Learning  Barriers/Preferences   Learning Barriers None    Learning Preferences Written Material;Group Instruction             Education Topics: Know Your Numbers Group instruction that is supported by a PowerPoint presentation. Instructor discusses importance of knowing and understanding resting, exercise, and post-exercise oxygen saturation, heart rate, and blood pressure. Oxygen saturation, heart rate, blood pressure, rating of perceived exertion, and dyspnea are reviewed along with a normal range for these values.    Exercise for the Pulmonary Patient Group instruction that is supported by a PowerPoint presentation. Instructor discusses benefits of exercise, core components of exercise, frequency, duration, and intensity of an exercise routine, importance of utilizing pulse oximetry during exercise, safety while exercising, and options of places to exercise outside of rehab.    MET Level  Group instruction provided by PowerPoint, verbal discussion, and written material to support subject matter. Instructor reviews what METs are and how to increase METs.    Pulmonary Medications Verbally interactive group education provided by instructor with focus on inhaled medications and proper administration.   Anatomy and Physiology of the Respiratory System Group instruction provided by PowerPoint, verbal discussion, and written material to support subject matter. Instructor reviews respiratory cycle and anatomical components of the respiratory system and their functions. Instructor also reviews differences in obstructive and restrictive respiratory diseases with examples of each.    Oxygen Safety Group instruction provided by PowerPoint, verbal discussion, and written material to support subject matter. There is an overview of "What is Oxygen" and "Why do we need it".  Instructor also reviews how to create a safe environment for oxygen use, the importance of using oxygen as prescribed, and the risks  of noncompliance. There is a brief discussion on traveling with oxygen and resources the patient may utilize.   Oxygen Use Group instruction provided by PowerPoint, verbal discussion, and written material to discuss how supplemental oxygen is prescribed and different types of oxygen supply systems. Resources for more information are provided.    Breathing Techniques Group instruction that is supported by demonstration and informational handouts. Instructor discusses the benefits of pursed lip and diaphragmatic breathing and detailed demonstration on how to perform both.     Risk Factor Reduction Group instruction that is supported by a PowerPoint presentation. Instructor discusses the definition of a risk factor, different risk factors for pulmonary disease, and how the heart and lungs work together.   Pulmonary Diseases Group instruction provided by PowerPoint, verbal discussion, and written material to support subject matter. Instructor gives an overview of the different type of pulmonary diseases. There is also a discussion on risk factors and symptoms as well as ways to manage the diseases.   Stress and Energy Conservation Group instruction provided by PowerPoint, verbal discussion, and written material to support subject matter. Instructor gives an overview of stress and the impact it can have on the body. Instructor also reviews ways to reduce stress. There is also a discussion on energy conservation and ways to conserve energy throughout the day.   Warning Signs and Symptoms Group instruction provided by PowerPoint, verbal discussion, and written material to support subject matter. Instructor reviews warning signs and symptoms of stroke, heart attack, cold and flu. Instructor also reviews ways to prevent the spread of infection.   Other Education Group or individual verbal, written, or video instructions that support the educational goals of the pulmonary rehab program.     Knowledge Questionnaire Score:  Knowledge Questionnaire Score - 08/24/23 1055       Knowledge Questionnaire Score   Pre Score 17/18             Core Components/Risk Factors/Patient Goals at Admission:  Personal Goals and Risk Factors at Admission - 08/24/23 1124       Core Components/Risk Factors/Patient Goals on Admission   Improve shortness of breath with ADL's Yes    Intervention Provide education, individualized exercise plan and daily activity instruction to help decrease symptoms of SOB with activities of daily living.    Expected Outcomes Short Term: Improve cardiorespiratory fitness to achieve a reduction of symptoms when performing ADLs;Long Term: Be able to perform more ADLs without symptoms or delay the onset of symptoms    Increase knowledge of respiratory medications and ability to use respiratory devices properly  Yes    Intervention Provide education and demonstration as needed of appropriate use of medications, inhalers, and oxygen therapy.    Expected Outcomes Short Term: Achieves understanding of medications use. Understands that oxygen is a medication prescribed by physician. Demonstrates appropriate use of inhaler and oxygen therapy.;Long Term: Maintain appropriate use of medications, inhalers, and oxygen therapy.             Core Components/Risk Factors/Patient Goals Review:    Core Components/Risk Factors/Patient Goals at Discharge (Final Review):    ITP Comments:   Comments: Dr. Mechele Collin is Medical Director for Pulmonary Rehab at [facility].

## 2023-08-27 ENCOUNTER — Telehealth (HOSPITAL_COMMUNITY): Payer: Self-pay

## 2023-08-27 NOTE — Telephone Encounter (Signed)
Pt called and left voicemail stating he would like to cancel PR at this time due to his insurance. He will be changing insurance in 2025 and would call at that time to reschedule PR.  Attempted to call patient to adv I have cancel all his PR aptts. LMTCB

## 2023-08-28 NOTE — Progress Notes (Signed)
Pulmonary Individual Treatment Plan  Patient Details  Name: Isaac Casey MRN: [id number] Date of Birth: Jul 24, 1961 Referring Provider:   Doristine Devoid Pulmonary Rehab Walk Test from 08/24/2023 in [facility]  Referring Provider Vassie Loll       Initial Encounter Date:  Flowsheet Row Pulmonary Rehab Walk Test from 08/24/2023 in [facility]  Date 08/24/23       Visit Diagnosis: Stage 2 moderate COPD by GOLD classification (HCC)  Patient's Home Medications on Admission:   Current Outpatient Medications:    albuterol (VENTOLIN HFA) 108 (90 Base) MCG/ACT inhaler, Inhale 2 puffs into the lungs every 6 (six) hours as needed for wheezing or shortness of breath., Disp: 1 each, Rfl: 2   Cholecalciferol (VITAMIN D PO), Take by mouth., Disp: , Rfl:    finasteride (PROSCAR) 5 MG tablet, Take 5 mg by mouth daily., Disp: , Rfl:    furosemide (LASIX) 20 MG tablet, TAKE 1 TABLET BY MOUTH EVERY DAY AS NEEDED, Disp: 90 tablet, Rfl: 1   ibuprofen (ADVIL,MOTRIN) 800 MG tablet, Take 1 tablet (800 mg total) by mouth 3 (three) times daily., Disp: 21 tablet, Rfl: 0   losartan (COZAAR) 25 MG tablet, TAKE 1 TABLET (25 MG TOTAL) BY MOUTH DAILY., Disp: 90 tablet, Rfl: 3   omega-3 acid ethyl esters (LOVAZA) 1 G capsule, Take by mouth 2 (two) times daily., Disp: , Rfl:    omeprazole (PRILOSEC) 20 MG capsule, Take 1 capsule by mouth daily., Disp: , Rfl:    rosuvastatin (CRESTOR) 20 MG tablet, Take 1 tablet (20 mg total) by mouth daily., Disp: 90 tablet, Rfl: 2   tamsulosin (FLOMAX) 0.4 MG CAPS capsule, Take 0.4 mg by mouth daily., Disp: , Rfl:    ALPRAZolam (XANAX) 0.5 MG tablet, Take 0.5 mg by mouth daily as needed. (Patient not taking: Reported on 08/24/2023), Disp: , Rfl:    ANORO ELLIPTA 62.5-25 MCG/ACT AEPB, TAKE 1 PUFF BY MOUTH EVERY DAY (Patient not taking: Reported on 08/24/2023), Disp:  60 each, Rfl: 5   sertraline (ZOLOFT) 25 MG tablet, Take 25 mg by mouth daily. (Patient not taking: Reported on 08/24/2023), Disp: , Rfl:    sildenafil (VIAGRA) 100 MG tablet, Take 1 tablet by mouth as needed. (Patient not taking: Reported on 08/24/2023), Disp: , Rfl:   Past Medical History: Past Medical History:  Diagnosis Date   GERD (gastroesophageal reflux disease)    Hiatal hernia    High cholesterol     Tobacco Use: Social History   Tobacco Use  Smoking Status Former   Current packs/day: 0.00   Average packs/day: 1 pack/day for 40.0 years (40.0 ttl pk-yrs)   Types: Cigarettes   Start date: 72   Quit date: 2016   Years since quitting: 8.9  Smokeless Tobacco Never    Labs: Review Flowsheet       Latest Ref Rng & Units 05/21/2020  Labs for ITP Cardiac and Pulmonary Rehab  Cholestrol 100 - 199 mg/dL 253   LDL (calc) 0 - 99 mg/dL 68   HDL-C >66 mg/dL 50   Trlycerides 0 - 440 mg/dL 347     Capillary Blood Glucose: No results found for: "GLUCAP"   Pulmonary Assessment Scores:  Pulmonary Assessment Scores     Row Name 08/24/23 1219         ADL UCSD   ADL Phase Entry     SOB Score  total 23       CAT Score   CAT Score 16       mMRC Score   mMRC Score 1             UCSD: Self-administered rating of dyspnea associated with activities of daily living (ADLs) 6-point scale (0 = "not at all" to 5 = "maximal or unable to do because of breathlessness")  Scoring Scores range from 0 to 120.  Minimally important difference is 5 units  CAT: CAT can identify the health impairment of COPD patients and is better correlated with disease progression.  CAT has a scoring range of zero to 40. The CAT score is classified into four groups of low (less than 10), medium (10 - 20), high (21-30) and very high (31-40) based on the impact level of disease on health status. A CAT score over 10 suggests significant symptoms.  A worsening CAT score could be explained by an  exacerbation, poor medication adherence, poor inhaler technique, or progression of COPD or comorbid conditions.  CAT MCID is 2 points  mMRC: mMRC (Modified Medical Research Council) Dyspnea Scale is used to assess the degree of baseline functional disability in patients of respiratory disease due to dyspnea. No minimal important difference is established. A decrease in score of 1 point or greater is considered a positive change.   Pulmonary Function Assessment:  Pulmonary Function Assessment - 08/24/23 1127       Breath   Bilateral Breath Sounds Clear    Shortness of Breath Yes;Limiting activity;Panic with Shortness of Breath             Exercise Target Goals: Exercise Program Goal: Individual exercise prescription set using results from initial 6 min walk test and THRR while considering  patient's activity barriers and safety.   Exercise Prescription Goal: Initial exercise prescription builds to 30-45 minutes a day of aerobic activity, 2-3 days per week.  Home exercise guidelines will be given to patient during program as part of exercise prescription that the participant will acknowledge.  Activity Barriers & Risk Stratification:  Activity Barriers & Cardiac Risk Stratification - 08/24/23 1125       Activity Barriers & Cardiac Risk Stratification   Activity Barriers Arthritis;Back Problems;Deconditioning;Muscular Weakness;Shortness of Breath    Cardiac Risk Stratification Moderate             6 Minute Walk:  6 Minute Walk     Row Name 08/24/23 1202         6 Minute Walk   Phase Initial     Distance 1320 feet     Walk Time 6 minutes     # of Rest Breaks 0     MPH 2.5     METS 3.31     RPE 11     Perceived Dyspnea  1     VO2 Peak 11.59     Symptoms No     Resting HR 84 bpm     Resting BP 124/72     Resting Oxygen Saturation  97 %     Exercise Oxygen Saturation  during 6 min walk 94 %     Max Ex. HR 106 bpm     Max Ex. BP 130/86     2 Minute Post BP  124/80       Interval HR   1 Minute HR 100     2 Minute HR 105     3 Minute HR 98  4 Minute HR 97     5 Minute HR 100     6 Minute HR 106     2 Minute Post HR 87     Interval Heart Rate? Yes       Interval Oxygen   Interval Oxygen? Yes     Baseline Oxygen Saturation % 97 %     1 Minute Oxygen Saturation % 95 %     1 Minute Liters of Oxygen 0 L     2 Minute Oxygen Saturation % 95 %     2 Minute Liters of Oxygen 0 L     3 Minute Oxygen Saturation % 94 %     3 Minute Liters of Oxygen 0 L     4 Minute Oxygen Saturation % 94 %     4 Minute Liters of Oxygen 0 L     5 Minute Oxygen Saturation % 97 %     5 Minute Liters of Oxygen 0 L     6 Minute Oxygen Saturation % 97 %     6 Minute Liters of Oxygen 0 L     2 Minute Post Oxygen Saturation % 99 %     2 Minute Post Liters of Oxygen 0 L              Oxygen Initial Assessment:  Oxygen Initial Assessment - 08/24/23 1126       Home Oxygen   Home Oxygen Device None    Sleep Oxygen Prescription CPAP    Home Exercise Oxygen Prescription None    Home Resting Oxygen Prescription None      Initial 6 min Walk   Oxygen Used None      Program Oxygen Prescription   Program Oxygen Prescription None      Intervention   Short Term Goals To learn and understand importance of monitoring SPO2 with pulse oximeter and demonstrate accurate use of the pulse oximeter.;To learn and understand importance of maintaining oxygen saturations>88%;To learn and demonstrate proper pursed lip breathing techniques or other breathing techniques. ;To learn and demonstrate proper use of respiratory medications    Long  Term Goals Maintenance of O2 saturations>88%;Compliance with respiratory medication;Verbalizes importance of monitoring SPO2 with pulse oximeter and return demonstration;Exhibits proper breathing techniques, such as pursed lip breathing or other method taught during program session;Demonstrates proper use of MDI's             Oxygen  Re-Evaluation:   Oxygen Discharge (Final Oxygen Re-Evaluation):   Initial Exercise Prescription:  Initial Exercise Prescription - 08/24/23 1200       Date of Initial Exercise RX and Referring Provider   Date 08/24/23    Referring Provider Vassie Loll    Expected Discharge Date 11/20/22      Treadmill   MPH 2.5    Grade 0    Minutes 15    METs 5      Elliptical   Level 8    Speed 7    Minutes 15    METs 6      Prescription Details   Frequency (times per week) 2    Duration Progress to 30 minutes of continuous aerobic without signs/symptoms of physical distress      Intensity   THRR 40-80% of Max Heartrate 63-126    Ratings of Perceived Exertion 11-13    Perceived Dyspnea 0-4      Progression   Progression Continue to progress workloads to maintain intensity without signs/symptoms of physical distress.  Resistance Training   Training Prescription Yes    Weight black bands    Reps 10-15             Perform Capillary Blood Glucose checks as needed.  Exercise Prescription Changes:   Exercise Comments:   Exercise Goals and Review:   Exercise Goals     Row Name 08/24/23 1126             Exercise Goals   Increase Physical Activity Yes       Intervention Provide advice, education, support and counseling about physical activity/exercise needs.;Develop an individualized exercise prescription for aerobic and resistive training based on initial evaluation findings, risk stratification, comorbidities and participant's personal goals.       Expected Outcomes Short Term: Attend rehab on a regular basis to increase amount of physical activity.;Long Term: Add in home exercise to make exercise part of routine and to increase amount of physical activity.;Long Term: Exercising regularly at least 3-5 days a week.       Increase Strength and Stamina Yes       Intervention Provide advice, education, support and counseling about physical activity/exercise needs.;Develop  an individualized exercise prescription for aerobic and resistive training based on initial evaluation findings, risk stratification, comorbidities and participant's personal goals.       Expected Outcomes Short Term: Increase workloads from initial exercise prescription for resistance, speed, and METs.;Short Term: Perform resistance training exercises routinely during rehab and add in resistance training at home;Long Term: Improve cardiorespiratory fitness, muscular endurance and strength as measured by increased METs and functional capacity ( )       Able to understand and use rate of perceived exertion (RPE) scale Yes       Intervention Provide education and explanation on how to use RPE scale       Expected Outcomes Short Term: Able to use RPE daily in rehab to express subjective intensity level;Long Term:  Able to use RPE to guide intensity level when exercising independently       Able to understand and use Dyspnea scale Yes       Intervention Provide education and explanation on how to use Dyspnea scale       Expected Outcomes Short Term: Able to use Dyspnea scale daily in rehab to express subjective sense of shortness of breath during exertion;Long Term: Able to use Dyspnea scale to guide intensity level when exercising independently       Knowledge and understanding of Target Heart Rate Range (THRR) Yes       Intervention Provide education and explanation of THRR including how the numbers were predicted and where they are located for reference       Expected Outcomes Short Term: Able to state/look up THRR;Long Term: Able to use THRR to govern intensity when exercising independently;Short Term: Able to use daily as guideline for intensity in rehab       Understanding of Exercise Prescription Yes       Intervention Provide education, explanation, and written materials on patient's individual exercise prescription       Expected Outcomes Short Term: Able to explain program exercise  prescription;Long Term: Able to explain home exercise prescription to exercise independently                Exercise Goals Re-Evaluation :  Exercise Goals Re-Evaluation     Row Name 08/27/23 1210             Exercise Goal Re-Evaluation   Exercise Goals  Review Increase Physical Activity;Increase Strength and Stamina;Able to understand and use rate of perceived exertion (RPE) scale;Able to understand and use Dyspnea scale;Knowledge and understanding of Target Heart Rate Range (THRR);Understanding of Exercise Prescription       Comments Pt to begin exercise 12/26. Will monitor for progression.       Expected Outcomes Through exercise in rehab and at home, the patient will decrease shortness of breath with daily activities and feel confident in doing an exercise regimen at home.                Discharge Exercise Prescription (Final Exercise Prescription Changes):   Nutrition:  Target Goals: Understanding of nutrition guidelines, daily intake of sodium 1500mg , cholesterol 200mg , calories 30% from fat and 7% or less from saturated fats, daily to have 5 or more servings of fruits and vegetables.  Biometrics:  Pre Biometrics - 08/24/23 1049       Pre Biometrics   Grip Strength 60 kg              Nutrition Therapy Plan and Nutrition Goals:   Nutrition Assessments:  MEDIFICTS Score Key: >=70 Need to make dietary changes  40-70 Heart Healthy Diet <= 40 Therapeutic Level Cholesterol Diet   Picture Your Plate Scores: <53 Unhealthy dietary pattern with much room for improvement. 41-50 Dietary pattern unlikely to meet recommendations for good health and room for improvement. 51-60 More healthful dietary pattern, with some room for improvement.  >60 Healthy dietary pattern, although there may be some specific behaviors that could be improved.    Nutrition Goals Re-Evaluation:   Nutrition Goals Discharge (Final Nutrition Goals  Re-Evaluation):   Psychosocial: Target Goals: Acknowledge presence or absence of significant depression and/or stress, maximize coping skills, provide positive support system. Participant is able to verbalize types and ability to use techniques and skills needed for reducing stress and depression.  Initial Review & Psychosocial Screening:  Initial Psych Review & Screening - 08/24/23 1119       Initial Review   Current issues with None Identified      Family Dynamics   Good Support System? Yes      Barriers   Psychosocial barriers to participate in program There are no identifiable barriers or psychosocial needs.      Screening Interventions   Interventions Encouraged to exercise             Quality of Life Scores:  Scores of 19 and below usually indicate a poorer quality of life in these areas.  A difference of  2-3 points is a clinically meaningful difference.  A difference of 2-3 points in the total score of the Quality of Life Index has been associated with significant improvement in overall quality of life, self-image, physical symptoms, and general health in studies assessing change in quality of life.  PHQ-9: Review Flowsheet       08/24/2023  Depression screen PHQ 2/9  Decreased Interest 0  Down, Depressed, Hopeless 0  PHQ - 2 Score 0  Altered sleeping 1  Tired, decreased energy 0  Change in appetite 0  Feeling bad or failure about yourself  0  Trouble concentrating 0  Moving slowly or fidgety/restless 0  Suicidal thoughts 0  PHQ-9 Score 1  Difficult doing work/chores Not difficult at all   Interpretation of Total Score  Total Score Depression Severity:  1-4 = Minimal depression, 5-9 = Mild depression, 10-14 = Moderate depression, 15-19 = Moderately severe depression, 20-27 = Severe depression  Psychosocial Evaluation and Intervention:  Psychosocial Evaluation - 08/24/23 1123       Psychosocial Evaluation & Interventions   Interventions Encouraged  to exercise with the program and follow exercise prescription    Comments Khiree denies any psychosocial barriers or concerns at this time.    Expected Outcomes For Goebel to participate in PR free of any psychosocial barriers or concerns    Continue Psychosocial Services  No Follow up required             Psychosocial Re-Evaluation:  Psychosocial Re-Evaluation     Row Name 08/27/23 [phone]             Psychosocial Re-Evaluation   Current issues with None Identified       Comments Guster is scheduled to start PR on 12/26. No new barriers or concerns since orientation on 12/20.       Expected Outcomes For Enos to participate in PR free of any psychosocial barriers or concerns.       Interventions Encouraged to attend Pulmonary Rehabilitation for the exercise       Continue Psychosocial Services  No Follow up required                Psychosocial Discharge (Final Psychosocial Re-Evaluation):  Psychosocial Re-Evaluation - 08/27/23 0853       Psychosocial Re-Evaluation   Current issues with None Identified    Comments Dominie is scheduled to start PR on 12/26. No new barriers or concerns since orientation on 12/20.    Expected Outcomes For Revan to participate in PR free of any psychosocial barriers or concerns.    Interventions Encouraged to attend Pulmonary Rehabilitation for the exercise    Continue Psychosocial Services  No Follow up required             Education: Education Goals: Education classes will be provided on a weekly basis, covering required topics. Participant will state understanding/return demonstration of topics presented.  Learning Barriers/Preferences:  Learning Barriers/Preferences - 08/24/23 1123       Learning Barriers/Preferences   Learning Barriers None    Learning Preferences Written Material;Group Instruction             Education Topics: Know Your Numbers Group instruction that is supported by a PowerPoint presentation.  Instructor discusses importance of knowing and understanding resting, exercise, and post-exercise oxygen saturation, heart rate, and blood pressure. Oxygen saturation, heart rate, blood pressure, rating of perceived exertion, and dyspnea are reviewed along with a normal range for these values.    Exercise for the Pulmonary Patient Group instruction that is supported by a PowerPoint presentation. Instructor discusses benefits of exercise, core components of exercise, frequency, duration, and intensity of an exercise routine, importance of utilizing pulse oximetry during exercise, safety while exercising, and options of places to exercise outside of rehab.    MET Level  Group instruction provided by PowerPoint, verbal discussion, and written material to support subject matter. Instructor reviews what METs are and how to increase METs.    Pulmonary Medications Verbally interactive group education provided by instructor with focus on inhaled medications and proper administration.   Anatomy and Physiology of the Respiratory System Group instruction provided by PowerPoint, verbal discussion, and written material to support subject matter. Instructor reviews respiratory cycle and anatomical components of the respiratory system and their functions. Instructor also reviews differences in obstructive and restrictive respiratory diseases with examples of each.    Oxygen Safety Group instruction provided by PowerPoint, verbal discussion, and written  material to support subject matter. There is an overview of "What is Oxygen" and "Why do we need it".  Instructor also reviews how to create a safe environment for oxygen use, the importance of using oxygen as prescribed, and the risks of noncompliance. There is a brief discussion on traveling with oxygen and resources the patient may utilize.   Oxygen Use Group instruction provided by PowerPoint, verbal discussion, and written material to discuss how  supplemental oxygen is prescribed and different types of oxygen supply systems. Resources for more information are provided.    Breathing Techniques Group instruction that is supported by demonstration and informational handouts. Instructor discusses the benefits of pursed lip and diaphragmatic breathing and detailed demonstration on how to perform both.     Risk Factor Reduction Group instruction that is supported by a PowerPoint presentation. Instructor discusses the definition of a risk factor, different risk factors for pulmonary disease, and how the heart and lungs work together.   Pulmonary Diseases Group instruction provided by PowerPoint, verbal discussion, and written material to support subject matter. Instructor gives an overview of the different type of pulmonary diseases. There is also a discussion on risk factors and symptoms as well as ways to manage the diseases.   Stress and Energy Conservation Group instruction provided by PowerPoint, verbal discussion, and written material to support subject matter. Instructor gives an overview of stress and the impact it can have on the body. Instructor also reviews ways to reduce stress. There is also a discussion on energy conservation and ways to conserve energy throughout the day.   Warning Signs and Symptoms Group instruction provided by PowerPoint, verbal discussion, and written material to support subject matter. Instructor reviews warning signs and symptoms of stroke, heart attack, cold and flu. Instructor also reviews ways to prevent the spread of infection.   Other Education Group or individual verbal, written, or video instructions that support the educational goals of the pulmonary rehab program.    Knowledge Questionnaire Score:  Knowledge Questionnaire Score - 08/24/23 1055       Knowledge Questionnaire Score   Pre Score 17/18             Core Components/Risk Factors/Patient Goals at Admission:  Personal  Goals and Risk Factors at Admission - 08/24/23 1124       Core Components/Risk Factors/Patient Goals on Admission   Improve shortness of breath with ADL's Yes    Intervention Provide education, individualized exercise plan and daily activity instruction to help decrease symptoms of SOB with activities of daily living.    Expected Outcomes Short Term: Improve cardiorespiratory fitness to achieve a reduction of symptoms when performing ADLs;Long Term: Be able to perform more ADLs without symptoms or delay the onset of symptoms    Increase knowledge of respiratory medications and ability to use respiratory devices properly  Yes    Intervention Provide education and demonstration as needed of appropriate use of medications, inhalers, and oxygen therapy.    Expected Outcomes Short Term: Achieves understanding of medications use. Understands that oxygen is a medication prescribed by physician. Demonstrates appropriate use of inhaler and oxygen therapy.;Long Term: Maintain appropriate use of medications, inhalers, and oxygen therapy.             Core Components/Risk Factors/Patient Goals Review:   Goals and Risk Factor Review     Row Name 08/27/23 0855             Core Components/Risk Factors/Patient Goals Review   Personal Goals Review  Improve shortness of breath with ADL's;Develop more efficient breathing techniques such as purse lipped breathing and diaphragmatic breathing and practicing self-pacing with activity.;Increase knowledge of respiratory medications and ability to use respiratory devices properly.       Review Alistar is scheduled to start the program on 12/26. We will monitor his progress toward his goals throughout the program.       Expected Outcomes See admission goals                Core Components/Risk Factors/Patient Goals at Discharge (Final Review):   Goals and Risk Factor Review - 08/27/23 0855       Core Components/Risk Factors/Patient Goals Review    Personal Goals Review Improve shortness of breath with ADL's;Develop more efficient breathing techniques such as purse lipped breathing and diaphragmatic breathing and practicing self-pacing with activity.;Increase knowledge of respiratory medications and ability to use respiratory devices properly.    Review Massi is scheduled to start the program on 12/26. We will monitor his progress toward his goals throughout the program.    Expected Outcomes See admission goals             ITP Comments:Pt is making expected progress toward Pulmonary Rehab goals after completing 0 session(s). Recommend continued exercise, life style modification, education, and utilization of breathing techniques to increase stamina and strength, while also decreasing shortness of breath with exertion.  Dr. Mechele Collin is Medical Director for Pulmonary Rehab at [facility].

## 2023-08-30 ENCOUNTER — Ambulatory Visit (HOSPITAL_COMMUNITY): Payer: 59

## 2023-08-31 ENCOUNTER — Ambulatory Visit
Admission: RE | Admit: 2023-08-31 | Discharge: 2023-08-31 | Disposition: A | Discharge status: Home / Self Care | Payer: 59 | Source: Ambulatory Visit | Attending: Family Medicine | Admitting: Family Medicine

## 2023-08-31 DIAGNOSIS — Z87891 Personal history of nicotine dependence: Secondary | ICD-10-CM

## 2023-08-31 DIAGNOSIS — R911 Solitary pulmonary nodule: Secondary | ICD-10-CM

## 2023-09-04 ENCOUNTER — Ambulatory Visit (HOSPITAL_COMMUNITY): Payer: 59

## 2023-09-06 ENCOUNTER — Ambulatory Visit (HOSPITAL_COMMUNITY): Payer: 59

## 2023-09-11 ENCOUNTER — Telehealth: Payer: Self-pay | Admitting: Pulmonary Disease

## 2023-09-11 ENCOUNTER — Ambulatory Visit (HOSPITAL_COMMUNITY): Payer: 59

## 2023-09-11 NOTE — Telephone Encounter (Signed)
 Pharm is [facility] on [address]  Please call in Trelegy. He liked the sample Dr. Vassie Loll provided him with and ins will cover.  His # is [phone]  Also, please call in his Albuterol inhaler. His has expired.

## 2023-09-13 ENCOUNTER — Ambulatory Visit (HOSPITAL_COMMUNITY): Payer: 59

## 2023-09-13 MED ORDER — ALBUTEROL SULFATE HFA 108 (90 BASE) MCG/ACT IN AERS: 2.0000 | INHALATION_SPRAY | Freq: Four times a day (QID) | RESPIRATORY_TRACT | 1 refills | Status: AC | PRN

## 2023-09-13 MED ORDER — TRELEGY ELLIPTA 100-62.5-25 MCG/ACT IN AEPB: 1.0000 | INHALATION_SPRAY | Freq: Every day | RESPIRATORY_TRACT | 1 refills | Status: DC

## 2023-09-15 NOTE — Telephone Encounter (Signed)
 Inhalers were sent in for pt.

## 2023-09-18 ENCOUNTER — Ambulatory Visit (HOSPITAL_COMMUNITY): Payer: 59

## 2023-09-20 ENCOUNTER — Ambulatory Visit (HOSPITAL_COMMUNITY): Payer: 59

## 2023-09-24 ENCOUNTER — Telehealth: Payer: Self-pay | Admitting: Acute Care

## 2023-09-24 DIAGNOSIS — R911 Solitary pulmonary nodule: Secondary | ICD-10-CM

## 2023-09-24 NOTE — Telephone Encounter (Signed)
Patient had LDCT follow up scan on 08/31/2023 that resulted as a LR2. Patient previously had a 4B scan in early 07/2023. Patient had a very large nodule that has shrunk significantly. Kandice Robinsons NP has review the scan and would still like to have a 6 month follow up scan to assure the nodule continues to resolve. Scan due 03/01/2024.

## 2023-09-24 NOTE — Telephone Encounter (Signed)
Called and spoke to pt. Informed him of the results and recommendations. Patient is aware the nodule has shrunk but Kandice Robinsons NP would prefer a 6 month follow up scan. Order has been placed. Pt verbalized understanding and denied any further questions or concerns at this time.

## 2023-09-25 ENCOUNTER — Ambulatory Visit (HOSPITAL_COMMUNITY): Payer: 59

## 2023-09-27 ENCOUNTER — Ambulatory Visit (HOSPITAL_COMMUNITY): Payer: 59

## 2023-10-02 ENCOUNTER — Ambulatory Visit (HOSPITAL_COMMUNITY): Payer: 59

## 2023-10-04 ENCOUNTER — Ambulatory Visit (HOSPITAL_COMMUNITY): Payer: 59

## 2023-10-09 ENCOUNTER — Ambulatory Visit (HOSPITAL_COMMUNITY): Payer: 59

## 2023-10-11 ENCOUNTER — Ambulatory Visit (HOSPITAL_COMMUNITY): Payer: 59

## 2023-10-16 ENCOUNTER — Ambulatory Visit (HOSPITAL_COMMUNITY): Payer: 59

## 2023-10-18 ENCOUNTER — Ambulatory Visit (HOSPITAL_COMMUNITY): Payer: 59

## 2023-10-22 ENCOUNTER — Ambulatory Visit: Payer: Self-pay | Admitting: Cardiology

## 2023-10-23 ENCOUNTER — Encounter: Payer: Self-pay | Admitting: Cardiology

## 2023-10-23 ENCOUNTER — Ambulatory Visit: Payer: Commercial Managed Care - HMO | Attending: Cardiology | Admitting: Cardiology

## 2023-10-23 VITALS — BP 112/76 | HR 90 | Ht 72.0 in | Wt 205.2 lb

## 2023-10-23 DIAGNOSIS — I10 Essential (primary) hypertension: Secondary | ICD-10-CM | POA: Diagnosis not present

## 2023-10-23 DIAGNOSIS — I2510 Atherosclerotic heart disease of native coronary artery without angina pectoris: Secondary | ICD-10-CM | POA: Diagnosis not present

## 2023-10-23 NOTE — Patient Instructions (Signed)
Medication Instructions:   Your physician recommends that you continue on your current medications as directed. Please refer to the Current Medication list given to you today.  *If you need a refill on your cardiac medications before your next appointment, please call your pharmacy*    Follow-Up: At [facility], you and your health needs are our priority.  As part of our continuing mission to provide you with exceptional heart care, we have created designated Provider Care Teams.  These Care Teams include your primary Cardiologist (physician) and Advanced Practice Providers (APPs -  Physician Assistants and Nurse Practitioners) who all work together to provide you with the care you need, when you need it.  We recommend signing up for the patient portal called "MyChart".  Sign up information is provided on this After Visit Summary.  MyChart is used to connect with patients for Virtual Visits (Telemedicine).  Patients are able to view lab/test results, encounter notes, upcoming appointments, etc.  Non-urgent messages can be sent to your provider as well.   To learn more about what you can do with MyChart, go to [email or web address].    Your next appointment:   1 year(s)  Provider:   Dr. Rosemary Holms

## 2023-10-23 NOTE — Progress Notes (Signed)
  Cardiology Office Note:  .   Date:  10/23/2023  ID:  Isaac Casey, DOB 04/27/1961, MRN [id number] PCP: Darrow Bussing, MD   [facility]Care Providers Cardiologist:  Truett Mainland, MD PCP: Darrow Bussing, MD  Chief Complaint  Patient presents with   Coronary artery disease of native artery of native heart wi   Follow-up      History of Present Illness: .    Isaac Casey is a 63 y.o. male with former smoker, w/COPD, coronary and aortic atherosclerosis   Patient denies any chest pain symptoms. Exertional dyspnea has improved. He has had two CT sans recently a few weeks apart, that showed improvement in Improvement in left lower lobe opacity, most consistent with resolving infection or aspiration.  Vitals:   10/23/23 1109  BP: 112/76  Pulse: 90  SpO2: 93%     ROS:  Review of Systems  Cardiovascular:  Negative for chest pain, dyspnea on exertion, leg swelling, palpitations and syncope.     Studies Reviewed: Marland Kitchen         Independently interpreted 08/2023: Chol 153, TG 88, HDL 59, LDL 78 Hb 16.2  08/2022: HbA1C 5.9%  06/2021: Cr 1.0  Echocardiogram 05/08/2023: Normal LV systolic function with visual EF 60-65%. Left ventricle cavity is normal in size. Normal left ventricular wall thickness. Normal global wall motion. Normal diastolic filling pattern, normal LAP. Mild (Grade I) mitral regurgitation. Mild pulmonic regurgitation. Compared to 08/02/2022 mild MR and PR are new otherwise no significant change.  Regadenoson (with Mod Bruce protocol) Nuclear stress test 04/26/2023: Myocardial perfusion is normal. Overall LV is normal in size and systolic function is normal without regional wall motion abnormalities. Stress LV EF: 49% but visually appears normal.  Nondiagnostic ECG stress. The heart rate response was consistent with Regadenoson.  No previous exam available for comparison. Low risk.     CT Chest 08/2023: 1. Lung-RADS 2, benign  appearance or behavior. Continue annual screening with low-dose chest CT without contrast in 12 months. The left lower lobe opacity has significantly improved, most consistent with resolving infection or aspiration. 2. Aortic atherosclerosis (ICD10-I70.0), coronary artery atherosclerosis and emphysema (ICD10-J43.9). 3. Esophageal air fluid level suggests dysmotility or gastroesophageal reflux.     Physical Exam:   Physical Exam Vitals and nursing note reviewed.  Constitutional:      General: He is not in acute distress. Neck:     Vascular: No JVD.  Cardiovascular:     Rate and Rhythm: Normal rate and regular rhythm.     Heart sounds: Normal heart sounds. No murmur heard. Pulmonary:     Effort: Pulmonary effort is normal.     Breath sounds: Normal breath sounds. No wheezing or rales.  Musculoskeletal:     Right lower leg: No edema.     Left lower leg: No edema.      VISIT DIAGNOSES:   ICD-10-CM   1. Primary hypertension  I10     2. Atherosclerosis of native coronary artery of native heart without angina pectoris  I25.10        ASSESSMENT AND PLAN: .    Isaac Casey is a 64 y.o. male with former smoker, w/COPD, coronary and aortic atherosclerosis   Chest pain, orthopnea: Resolved.  Recent echocardiogram with normal EF.  CAD: Nonobstructive disease. Continue statin. Lipids well-controlled.  Hypertension: Well-controlled on losartan.    F/u in 1 year  Signed, Elder Negus, MD

## 2023-10-25 ENCOUNTER — Ambulatory Visit (HOSPITAL_COMMUNITY): Payer: 59

## 2023-10-30 ENCOUNTER — Ambulatory Visit (HOSPITAL_COMMUNITY): Payer: 59

## 2023-11-01 ENCOUNTER — Ambulatory Visit (HOSPITAL_COMMUNITY): Payer: 59

## 2023-11-06 ENCOUNTER — Ambulatory Visit (HOSPITAL_COMMUNITY): Payer: 59

## 2023-11-08 ENCOUNTER — Ambulatory Visit (HOSPITAL_COMMUNITY): Payer: 59

## 2023-11-13 ENCOUNTER — Ambulatory Visit (HOSPITAL_BASED_OUTPATIENT_CLINIC_OR_DEPARTMENT_OTHER): Payer: 59 | Admitting: Pulmonary Disease

## 2023-11-13 ENCOUNTER — Ambulatory Visit (HOSPITAL_COMMUNITY): Payer: 59

## 2023-11-13 ENCOUNTER — Encounter (HOSPITAL_BASED_OUTPATIENT_CLINIC_OR_DEPARTMENT_OTHER): Payer: Self-pay | Admitting: Pulmonary Disease

## 2023-11-13 VITALS — BP 112/74 | HR 88 | Ht 72.0 in | Wt 197.0 lb

## 2023-11-13 DIAGNOSIS — J432 Centrilobular emphysema: Secondary | ICD-10-CM

## 2023-11-13 DIAGNOSIS — R911 Solitary pulmonary nodule: Secondary | ICD-10-CM

## 2023-11-13 DIAGNOSIS — Z87891 Personal history of nicotine dependence: Secondary | ICD-10-CM

## 2023-11-13 LAB — PULMONARY FUNCTION TEST
DL/VA % pred: 55 %
DL/VA: 2.31 ml/min/mmHg/L
DLCO cor % pred: 62 %
DLCO cor: 17.93 ml/min/mmHg
DLCO unc % pred: 62 %
DLCO unc: 17.93 ml/min/mmHg
FEF 25-75 Post: 1.17 L/s
FEF 25-75 Pre: 1.05 L/s
FEF2575-%Change-Post: 11 %
FEF2575-%Pred-Post: 38 %
FEF2575-%Pred-Pre: 34 %
FEV1-%Change-Post: 3 %
FEV1-%Pred-Post: 68 %
FEV1-%Pred-Pre: 66 %
FEV1-Post: 2.6 L
FEV1-Pre: 2.5 L
FEV1FVC-%Change-Post: 1 %
FEV1FVC-%Pred-Pre: 71 %
FEV6-%Change-Post: 3 %
FEV6-%Pred-Post: 96 %
FEV6-%Pred-Pre: 93 %
FEV6-Post: 4.63 L
FEV6-Pre: 4.49 L
FEV6FVC-%Change-Post: 0 %
FEV6FVC-%Pred-Post: 101 %
FEV6FVC-%Pred-Pre: 101 %
FVC-%Change-Post: 2 %
FVC-%Pred-Post: 95 %
FVC-%Pred-Pre: 93 %
FVC-Post: 4.77 L
FVC-Pre: 4.67 L
Post FEV1/FVC ratio: 54 %
Post FEV6/FVC ratio: 97 %
Pre FEV1/FVC ratio: 54 %
Pre FEV6/FVC Ratio: 96 %
RV % pred: 135 %
RV: 3.28 L
TLC % pred: 120 %
TLC: 8.93 L

## 2023-11-13 NOTE — Patient Instructions (Signed)
 Full PFT Performed Today

## 2023-11-13 NOTE — Progress Notes (Signed)
 Full PFT Performed Today

## 2023-11-13 NOTE — Progress Notes (Signed)
   Subjective:    Patient ID: Isaac Casey, male    DOB: 1961-01-12, 63 y.o.   MRN: [id number]  HPI  63 yo for FU of emphysema &  Dyspnea He smoked more than 40 pack years before he quit in 2016   The patient, with a history of emphysema and a previously noted left lower opacity, presents for a follow-up visit. The opacity was initially noted on a CT scan and has since decreased in size from thirty millimeters to fourteen millimeters. The patient reports a persistent pain in the upper right back, which has been progressively getting worse since the end of last summer. The pain is located deep in the right scapula and has been causing discomfort, especially at night. The patient also mentions a previous shoulder injury, which he believes may be a rotator cuff injury.  Significant tests/ events reviewed  PFTs 11/2023 moderate airway obstruction with FEV1 66%, TLC 120% DLCO 62%  PFTs 01/2020, moderate airway obstruction, ratio 57, FEV1 64%, FVC 84%, DLCO 68%  LDCT chest 08/2023 LLL opacity smaller 14 mm c/w resolving pna  LDCT chest 07/2023    new nodular area within the posterior left base which measures 31.6 mm. New compared to 07/2022 LDCT 07/2020 rads 2 LDCT 06/2019  RADS 2 , Pulmonary nodules measure 4.1 mm or less in size   LDCT 06/2018 - RADS-2S with right upper lobe 5 mm nodule and mild centrilobular emphysema.  Review of Systems neg for any significant sore throat, dysphagia, itching, sneezing, nasal congestion or excess/ purulent secretions, fever, chills, sweats, unintended wt loss, pleuritic or exertional cp, hempoptysis, orthopnea pnd or change in chronic leg swelling. Also denies presyncope, palpitations, heartburn, abdominal pain, nausea, vomiting, diarrhea or change in bowel or urinary habits, dysuria,hematuria, rash, arthralgias, visual complaints, headache, numbness weakness or ataxia.     Objective:   Physical Exam  Gen. Pleasant, well-nourished, in no  distress ENT - no thrush, no pallor/icterus,no post nasal drip Neck: No JVD, no thyromegaly, no carotid bruits Lungs: no use of accessory muscles, no dullness to percussion, clear without rales or rhonchi  Cardiovascular: Rhythm regular, heart sounds  normal, no murmurs or gallops, no peripheral edema Musculoskeletal: No deformities, no cyanosis or clubbing        Assessment & Plan:   Emphysema Emphysema is well-managed with lung function at 66-68%, indicating no significant change over the past few years. Total lung capacity is at 120%, suggesting hyperinflation, and DLCO is at 62%. The condition is attributed to past smoking, with no further damage since cessation. Lung function is approximately one-third impaired compared to normal. - Continue Trelegy for COPD management.  Left lower lung opacity The left lower lung opacity has decreased from 30 mm to 14 mm since the last CT scan. The reduction over eight weeks suggests a non-malignant process, as cancer typically does not decrease in size. It is expected to completely resolve over time. - Schedule a follow-up CT scan in 12 months to monitor the opacity.  Right scapular pain Progressive pain in the right upper back near the scapula is suspected to be musculoskeletal. The pain, present since last summer, worsens with certain positions. The primary care physician suspects a muscle issue, and the CT scan did not reveal any bone lesions or chest wall problems. - Proceed with physical therapy as arranged by the primary care physician. - Follow up if symptoms worsen.

## 2023-11-13 NOTE — Patient Instructions (Signed)
?  Continue on trelegy ? ? ?

## 2023-11-15 ENCOUNTER — Ambulatory Visit (HOSPITAL_COMMUNITY): Payer: 59

## 2023-11-20 ENCOUNTER — Ambulatory Visit (HOSPITAL_COMMUNITY): Payer: 59

## 2024-01-08 ENCOUNTER — Telehealth (HOSPITAL_BASED_OUTPATIENT_CLINIC_OR_DEPARTMENT_OTHER): Payer: Self-pay

## 2024-01-08 NOTE — Telephone Encounter (Signed)
 Please advise pt last OV 11/13/2023  Copied from CRM #[id number]. Topic: Clinical - Medical Advice >> Jan 08, 2024  8:14 AM Margarette Shawl wrote: Reason for CRM:   Pt is having similar symptoms to when he experienced a lung infection last year. Experiencing SOB, cough, and sneezing for past 4 to 5 days. Has been doing lawn care, and admits he does not wear a mask when weed eating but does when mowing. He reports the pollen has been bad when he has performed yard work.   Was prescribed an antibiotic last year that was beneficial and is requesting if another round of antibiotics could be called in. He is requesting medication be sent to [facility] on file.   CB#  [phone]

## 2024-01-09 MED ORDER — AZITHROMYCIN 250 MG PO TABS: ORAL_TABLET | ORAL | 0 refills | Status: DC

## 2024-01-09 NOTE — Addendum Note (Signed)
 Addended by: Kary Pages on: 01/09/2024 10:10 AM   Modules accepted: Orders

## 2024-01-09 NOTE — Telephone Encounter (Signed)
 Pt notified and rx sent to pharmacy

## 2024-01-25 ENCOUNTER — Telehealth (HOSPITAL_BASED_OUTPATIENT_CLINIC_OR_DEPARTMENT_OTHER): Payer: Self-pay

## 2024-01-25 MED ORDER — PREDNISONE 10 MG PO TABS: ORAL_TABLET | ORAL | 0 refills | Status: DC

## 2024-01-25 MED ORDER — AZITHROMYCIN 250 MG PO TABS: ORAL_TABLET | ORAL | 0 refills | Status: AC

## 2024-01-25 NOTE — Addendum Note (Signed)
 Addended by: Kary Pages on: 01/25/2024 12:33 PM   Modules accepted: Orders

## 2024-01-25 NOTE — Telephone Encounter (Signed)
 Copied from CRM [id number]. Topic: Clinical - Medication Question >> Jan 25, 2024 10:18 AM Crist Dominion wrote: Reason for CRM: Patient states his lung infection has come back and is requesting advice from Dr. Alva as the azithromycin  (ZITHROMAX ) 250 MG tablet helped him a lot but the infection came back, should he try a new medication or come in for an office visit? Please advise as patient states he only slept 3 hours last night.

## 2024-01-25 NOTE — Telephone Encounter (Signed)
 Rx sent to pharmacy and pt notified  ?

## 2024-03-03 ENCOUNTER — Ambulatory Visit
Admission: RE | Admit: 2024-03-03 | Discharge: 2024-03-03 | Disposition: A | Discharge status: Home / Self Care | Source: Ambulatory Visit | Attending: Acute Care | Admitting: Acute Care

## 2024-03-03 DIAGNOSIS — R911 Solitary pulmonary nodule: Secondary | ICD-10-CM

## 2024-03-10 ENCOUNTER — Ambulatory Visit (INDEPENDENT_AMBULATORY_CARE_PROVIDER_SITE_OTHER): Admitting: Radiology

## 2024-03-10 ENCOUNTER — Ambulatory Visit
Admission: EM | Admit: 2024-03-10 | Discharge: 2024-03-10 | Disposition: A | Discharge status: Home / Self Care | Attending: Physician Assistant | Admitting: Physician Assistant

## 2024-03-10 ENCOUNTER — Other Ambulatory Visit: Payer: Self-pay

## 2024-03-10 ENCOUNTER — Ambulatory Visit: Payer: Self-pay

## 2024-03-10 DIAGNOSIS — J441 Chronic obstructive pulmonary disease with (acute) exacerbation: Secondary | ICD-10-CM

## 2024-03-10 DIAGNOSIS — J432 Centrilobular emphysema: Secondary | ICD-10-CM

## 2024-03-10 MED ORDER — PREDNISONE 20 MG PO TABS: 40.0000 mg | ORAL_TABLET | Freq: Every day | ORAL | 0 refills | Status: AC

## 2024-03-10 MED ORDER — AMOXICILLIN-POT CLAVULANATE 875-125 MG PO TABS: 1.0000 | ORAL_TABLET | Freq: Two times a day (BID) | ORAL | 0 refills | Status: AC

## 2024-03-10 NOTE — Telephone Encounter (Signed)
 Can we get Isaac Casey in with Alva anytime soon?

## 2024-03-10 NOTE — Telephone Encounter (Signed)
 Patient called back with questions. Patient explained the purpose of being seen within 4 hours due to symptoms. Discussed with patient that the best options today to be seen are with his PCP or at Urgent care as he is needing to be evaluated by a provider. Patient instructed that a message would be sent to Dr. Cyndi office but in the meantime patient should be evaluated by his PCP or at Urgent Care. Patient asked for information on Urgent Care locations. Patient given information on [facility]. Patient verbalized that he would be seen there and get evaluated. Patient would like a phone call from pulmonary staff about an appointment with Dr. Jude.

## 2024-03-10 NOTE — Discharge Instructions (Addendum)
 You were seen today for concerns of shortness of breath and chest tightness. Your physical exam and vitals are overall reassuring.  I am still waiting on radiology to review your x-ray but did not see any obvious signs of pneumonia or effusion at this time.  We will keep you updated if any changes are noted by the radiologist that would impact your management plan.  At this time I suspect that you are likely having a COPD exacerbation.  I am sending in a steroid burst to help with your breathing and an antibiotic to help prevent further exacerbation or escalation in your symptoms.  Please take these medications as directed and finish the entire course unless you are instructed by a provider to stop or if you develop an allergic reaction.  If you feel like your symptoms are not improving or seem to be worsening I recommend following up with your pulmonologist or for severe symptoms going to the ED

## 2024-03-10 NOTE — Telephone Encounter (Signed)
 1st attempt, LVM    Copied from CRM [id number]. Topic: Clinical - Red Word Triage >> Mar 10, 2024  8:21 AM Isabell A wrote: [facility] that prompted transfer to Nurse Triage: Experiencing SOB, no energy, thinks he may have a lung infection - requesting antibiotics. >> Mar 10, 2024  8:34 AM Isabell A wrote: Requesting a call back at [phone]

## 2024-03-10 NOTE — Telephone Encounter (Signed)
 FYI Only or Action Required?: Action required by provider: medication refill request.  Patient was last seen in primary care on na. Called Nurse Triage reporting Shortness of Breath. Symptoms began several days ago. Interventions attempted: Nothing. Symptoms are: unchanged.  Triage Disposition: See HCP Within 4 Hours (Or PCP Triage) Would like antibiotic called in.  Patient/caregiver understands and will follow disposition?:  Reason for Disposition  [1] MILD difficulty breathing (e.g., minimal/no SOB at rest, SOB with walking, pulse <100) AND [2] NEW-onset or WORSE than normal  Answer Assessment - Initial Assessment Questions 1. RESPIRATORY STATUS: Describe your breathing? (e.g., wheezing, shortness of breath, unable to speak, severe coughing)      Shortness of breath 2. ONSET: When did this breathing problem begin?      Several days ago 3. PATTERN Does the difficult breathing come and go, or has it been constant since it started?      constant 4. SEVERITY: How bad is your breathing? (e.g., mild, moderate, severe)    - MILD: No SOB at rest, mild SOB with walking, speaks normally in sentences, can lie down, no retractions, pulse < 100.    - MODERATE: SOB at rest, SOB with minimal exertion and prefers to sit, cannot lie down flat, speaks in phrases, mild retractions, audible wheezing, pulse 100-120.    - SEVERE: Very SOB at rest, speaks in single words, struggling to breathe, sitting hunched forward, retractions, pulse > 120      Hx copd, moderate 5. RECURRENT SYMPTOM: Have you had difficulty breathing before? If Yes, ask: When was the last time? and What happened that time?      yes 6. CARDIAC HISTORY: Do you have any history of heart disease? (e.g., heart attack, angina, bypass surgery, angioplasty)      denies 7. LUNG HISTORY: Do you have any history of lung disease?  (e.g., pulmonary embolus, asthma, emphysema)     copd 8. CAUSE: What do you think is causing the  breathing problem?      Lung infection 9. OTHER SYMPTOMS: Do you have any other symptoms? (e.g., dizziness, runny nose, cough, chest pain, fever)     fatugye 10. O2 SATURATION MONITOR:  Do you use an oxygen saturation monitor (pulse oximeter) at home? If Yes, ask: What is your reading (oxygen level) today? What is your usual oxygen saturation reading? (e.g., 95%)       na 11. PREGNANCY: Is there any chance you are pregnant? When was your last menstrual period?       na 12. TRAVEL: Have you traveled out of the country in the last month? (e.g., travel history, exposures)       no  Protocols used: Breathing Difficulty-A-AH

## 2024-03-10 NOTE — ED Triage Notes (Addendum)
 Pt presents with complaints of SOB that began on Thursday 7/3. States he has a hx of COPD. Prescribed inhalers used (Albuterol , Trelegy) with little relief. Weakness, fatigue, and chills reported yesterday. Currently denies pain. O2 95% on room air in triage. Denies fevers & sick contacts.

## 2024-03-10 NOTE — ED Provider Notes (Signed)
 [facility]    CSN: [id number] Arrival date & time: 03/10/24  1155      History   Chief Complaint Chief Complaint  Patient presents with   Shortness of Breath    HPI Ladarion Munyon is a 63 y.o. male.   HPI  Pt reports concerns for SOB that has been ongoing since Thurs night (7/3) He thinks he is doing better compared to yesterday   He states he went [place] a [place] ride in the mountains last weekend and felt like he overheated. He had to leave early and felt fatigued and weak that weekend  He reports reduced PO intake and fatigue but thinks he got a good night sleep last night He reports he has picked up an oral  decongestant as he noticed some nasal congestion which was preventing him from doing his breathing exercises  He is using his rescue inhaler a few times per day - states he used it a few times yesterday and once today so far  He has been using his Trelegy as directed   Past Medical History:  Diagnosis Date   GERD (gastroesophageal reflux disease)    Hiatal hernia    High cholesterol     Patient Active Problem List   Diagnosis Date Noted   Primary hypertension 10/23/2023   Precordial pain 04/19/2023   Abnormal findings diagnostic imaging of heart and coronary circulation 07/24/2022   Atherosclerosis of native coronary artery of native heart without angina pectoris    Stable angina pectoris (HCC) 06/09/2021   Leg edema 05/20/2020   Pulmonary nodule 01/20/2020   Centrilobular emphysema (HCC) 11/15/2018   Coronary artery calcification 11/15/2018    Past Surgical History:  Procedure Laterality Date   ANTERIOR CRUCIATE LIGAMENT REPAIR         Home Medications    Prior to Admission medications   Medication Sig Start Date End Date Taking? Authorizing Provider  albuterol  (VENTOLIN  HFA) 108 (90 Base) MCG/ACT inhaler Inhale 2 puffs into the lungs every 6 (six) hours as needed for wheezing or shortness of breath. 09/13/23  Yes Alva, Rakesh  V, MD  amoxicillin -clavulanate (AUGMENTIN ) 875-125 MG tablet Take 1 tablet by mouth every 12 (twelve) hours. 03/10/24  Yes Euclid Cassetta E, PA-C  predniSONE  (DELTASONE ) 20 MG tablet Take 2 tablets (40 mg total) by mouth daily for 5 days. 03/10/24 03/15/24 Yes Cailey Trigueros E, PA-C  ALPRAZolam (XANAX) 0.5 MG tablet Take 0.5 mg by mouth daily as needed.    [provider]  ANORO ELLIPTA  62.5-25 MCG/ACT AEPB TAKE 1 PUFF BY MOUTH EVERY DAY 06/17/23   Cobb, Comer GAILS, NP  azithromycin  (ZITHROMAX ) 250 MG tablet Take 2 tabs day 1 then one daily day 2-5 01/25/24   Alva, Rakesh V, MD  Cholecalciferol (VITAMIN D PO) Take by mouth.    [provider]  finasteride (PROSCAR) 5 MG tablet Take 5 mg by mouth daily. 08/15/23   [provider]  Fluticasone-Umeclidin-Vilant (TRELEGY ELLIPTA ) 100-62.5-25 MCG/ACT AEPB Inhale 1 puff into the lungs daily. 09/13/23   Jude Harden GAILS, MD  furosemide  (LASIX ) 20 MG tablet TAKE 1 TABLET BY MOUTH EVERY DAY AS NEEDED 01/24/21   Patwardhan, Newman PARAS, MD  ibuprofen  (ADVIL ,MOTRIN ) 800 MG tablet Take 1 tablet (800 mg total) by mouth 3 (three) times daily. 10/28/14   Muthersbaugh, Chiquita, PA-C  Krill Oil 1000 MG CAPS 1 capsule Orally once a day    [provider]  losartan  (COZAAR ) 25 MG tablet TAKE 1  TABLET (25 MG TOTAL) BY MOUTH DAILY. 07/13/23   Patwardhan, Newman PARAS, MD  omega-3 acid ethyl esters (LOVAZA) 1 G capsule Take by mouth 2 (two) times daily.    [provider]  omeprazole (PRILOSEC) 20 MG capsule Take 1 capsule by mouth daily.    [provider]  rosuvastatin  (CRESTOR ) 20 MG tablet Take 1 tablet (20 mg total) by mouth daily. 07/13/23   Patwardhan, Newman PARAS, MD  sertraline (ZOLOFT) 25 MG tablet Take 25 mg by mouth daily. 08/08/23   [provider]  sildenafil (VIAGRA) 100 MG tablet Take 1 tablet by mouth as needed. 04/17/19   [provider]  tamsulosin (FLOMAX) 0.4 MG CAPS capsule Take 0.4 mg by mouth daily. 07/12/21    [provider]    Family History Family History  Problem Relation Age of Onset   Atrial fibrillation Mother    Hypertension Mother    Heart disease Mother    Thyroid disease Mother    Heart disease Father    Diabetes Father    Heart disease Sister    Heart disease Brother    Hypertension Brother     Social History Social History   Tobacco Use   Smoking status: Former    Current packs/day: 0.00    Average packs/day: 1 pack/day for 40.0 years (40.0 ttl pk-yrs)    Types: Cigarettes    Start date: 37    Quit date: 2016    Years since quitting: 9.5   Smokeless tobacco: Never  Vaping Use   Vaping status: Never Used  Substance Use Topics   Alcohol use: Yes    Comment: Occasional once a week   Drug use: No     Allergies   Wellbutrin [bupropion]   Review of Systems Review of Systems  Constitutional:  Positive for fatigue. Negative for chills and fever.  HENT:  Positive for congestion.   Respiratory:  Positive for shortness of breath. Negative for cough.      Physical Exam Triage Vital Signs ED Triage Vitals  Encounter Vitals Group     BP 03/10/24 1205 115/82     Girls Systolic BP Percentile --      Girls Diastolic BP Percentile --      Boys Systolic BP Percentile --      Boys Diastolic BP Percentile --      Pulse Rate 03/10/24 1205 90     Resp 03/10/24 1205 20     Temp 03/10/24 1205 98.2 F (36.8 C)     Temp Source 03/10/24 1205 Oral     SpO2 03/10/24 1205 95 %     Weight 03/10/24 1208 195 lb (88.5 kg)     Height 03/10/24 1208 6' (1.829 m)     Head Circumference --      Peak Flow --      Pain Score 03/10/24 1207 0     Pain Loc --      Pain Education --      Exclude from Growth Chart --    No data found.  Updated Vital Signs BP 115/82 (BP Location: Right Arm)   Pulse 90   Temp 98.2 F (36.8 C) (Oral)   Resp 20   Ht 6' (1.829 m)   Wt 195 lb (88.5 kg)   SpO2 95%   BMI 26.45 kg/m   Visual Acuity Right Eye Distance:   Left  Eye Distance:   Bilateral Distance:    Right Eye Near:   Left  Eye Near:    Bilateral Near:     Physical Exam Vitals reviewed.  Constitutional:      General: He is awake.     Appearance: Normal appearance. He is well-developed and well-groomed.  HENT:     Head: Normocephalic and atraumatic.  Cardiovascular:     Rate and Rhythm: Normal rate and regular rhythm.     Pulses: Normal pulses.          Radial pulses are 2+ on the right side and 2+ on the left side.     Heart sounds: Normal heart sounds. No murmur heard.    No friction rub. No gallop.  Pulmonary:     Effort: Pulmonary effort is normal.     Breath sounds: Normal breath sounds. Decreased air movement present. No decreased breath sounds, wheezing, rhonchi or rales.  Musculoskeletal:     Cervical back: Normal range of motion.     Right lower leg: No edema.     Left lower leg: No edema.  Neurological:     Mental Status: He is alert and oriented to person, place, and time.  Psychiatric:        Attention and Perception: Attention and perception normal.        Mood and Affect: Mood and affect normal.        Speech: Speech normal.        Behavior: Behavior normal. Behavior is cooperative.        Thought Content: Thought content normal.        Cognition and Memory: Cognition normal.      UC Treatments / Results  Labs (all labs ordered are listed, but only abnormal results are displayed) Labs Reviewed - No data to display  EKG   Radiology DG Chest 2 View Result Date: 03/10/2024 CLINICAL DATA:  Shortness of breath EXAM: CHEST - 2 VIEW COMPARISON:  Chest radiograph dated 04/05/2023 FINDINGS: Normal lung volumes. No focal consolidations. No pleural effusion or pneumothorax. The heart size and mediastinal contours are within normal limits. No acute osseous abnormality. IMPRESSION: No active cardiopulmonary disease. Electronically Signed   By: Limin  Xu M.D.   On: 03/10/2024 13:34    Procedures Procedures (including  critical care time)  Medications Ordered in UC Medications - No data to display  Initial Impression / Assessment and Plan / UC Course  I have reviewed the triage vital signs and the nursing notes.  Pertinent labs & imaging results that were available during my care of the patient were reviewed by me and considered in my medical decision making (see chart for details).      Final Clinical Impressions(s) / UC Diagnoses   Final diagnoses:  Centrilobular emphysema (HCC)  COPD exacerbation (HCC)   Pt presents today for SOB and chest tightness that has been ongoing since 03/06/24 despite increased use of albuterol  inhaler and consistent use of maintenance therapies as prescribed. Pt has previous hx of COPD for which he is managed by Pulmonology. Pt reports concerns for pneumonia given presentation. Physical is negative for wheezes, rhonchi, rales or decreased breath sounds. Vitals show O2 saturation of 95% without labored breathing. Reviewed these findings with patient and recommend steroid burst and abx for suspected COPD exacerbation. Pt would like CXR for rule out today. CXR was negative for acute cardiopulmonary illness based on my own interpretation and radiology. Will proceed with prednisone  40 mg PO every day x 5 days burst and Augmentin  po BID x 7 days. ED and return precautions reviewed  and provided in AVS. Follow up as needed for persistent or progressing symptoms      Discharge Instructions      You were seen today for concerns of shortness of breath and chest tightness. Your physical exam and vitals are overall reassuring.  I am still waiting on radiology to review your x-ray but did not see any obvious signs of pneumonia or effusion at this time.  We will keep you updated if any changes are noted by the radiologist that would impact your management plan.  At this time I suspect that you are likely having a COPD exacerbation.  I am sending in a steroid burst to help with your  breathing and an antibiotic to help prevent further exacerbation or escalation in your symptoms.  Please take these medications as directed and finish the entire course unless you are instructed by a provider to stop or if you develop an allergic reaction.  If you feel like your symptoms are not improving or seem to be worsening I recommend following up with your pulmonologist or for severe symptoms going to the ED     ED Prescriptions     Medication Sig Dispense Auth. Provider   predniSONE  (DELTASONE ) 20 MG tablet Take 2 tablets (40 mg total) by mouth daily for 5 days. 10 tablet Kratos Ruscitti E, PA-C   amoxicillin -clavulanate (AUGMENTIN ) 875-125 MG tablet Take 1 tablet by mouth every 12 (twelve) hours. 14 tablet Samyuktha Brau E, PA-C      PDMP not reviewed this encounter.   Maylee Bare, Rocky BRAVO, PA-C 03/10/24 1501

## 2024-03-11 ENCOUNTER — Telehealth (HOSPITAL_BASED_OUTPATIENT_CLINIC_OR_DEPARTMENT_OTHER): Payer: Self-pay | Admitting: Pulmonary Disease

## 2024-03-11 NOTE — Telephone Encounter (Signed)
 Spoke with pt and he went to [facility] yesterday and does not need a F/U at this time

## 2024-03-11 NOTE — Telephone Encounter (Signed)
 Copied from CRM [id number]. Topic: Clinical - Medication Question >> Mar 11, 2024  9:09 AM Isaac Casey wrote: Reason for CRM: Patient seen by UC yesterday and prescribed antibiotics and prednisone . Patient  states the prescription for prednisone  is too strong. Patient prescribed Prednisone  20mg  - take 2 tablets by mouth for 5 days.   Patient states Dr. Jude had prescribed prednisone  before but the script was for 10mg  - 2 daily for 5 days and then 1 daily for 5 days.   Patient states he took the first dosage of 20 mg of prednisone  last night and was unable to sleep. Patient states that dosage is too strong for him and he will not be able to take the medication as prescribed. Patient inquiring if he could have the previous script Dr. Alva had prescribed in May.   Best call back number: [phone]

## 2024-03-11 NOTE — Telephone Encounter (Signed)
 Please advise on Prednisone  dosage

## 2024-03-12 ENCOUNTER — Other Ambulatory Visit: Payer: Self-pay

## 2024-03-12 DIAGNOSIS — Z122 Encounter for screening for malignant neoplasm of respiratory organs: Secondary | ICD-10-CM

## 2024-03-12 DIAGNOSIS — Z87891 Personal history of nicotine dependence: Secondary | ICD-10-CM

## 2024-03-16 ENCOUNTER — Other Ambulatory Visit: Payer: Self-pay | Admitting: Pulmonary Disease

## 2024-04-16 ENCOUNTER — Other Ambulatory Visit: Payer: Self-pay | Admitting: Cardiology

## 2024-04-16 DIAGNOSIS — I25118 Atherosclerotic heart disease of native coronary artery with other forms of angina pectoris: Secondary | ICD-10-CM

## 2024-07-15 ENCOUNTER — Other Ambulatory Visit: Payer: Self-pay | Admitting: Cardiology

## 2024-07-15 DIAGNOSIS — R0609 Other forms of dyspnea: Secondary | ICD-10-CM

## 2024-09-13 ENCOUNTER — Other Ambulatory Visit: Payer: Self-pay | Admitting: Pulmonary Disease

## 2024-11-12 ENCOUNTER — Ambulatory Visit: Admitting: Cardiology
